# Patient Record
Sex: Female | Born: 1975 | Race: White | Hispanic: No | Marital: Married | Smoking: Never smoker
Health system: Southern US, Community
[De-identification: ages and names within clinical notes are randomized; demographics above are authoritative.]

## PROBLEM LIST (undated history)

## (undated) DIAGNOSIS — N83209 Unspecified ovarian cyst, unspecified side: Secondary | ICD-10-CM

## (undated) DIAGNOSIS — O139 Gestational [pregnancy-induced] hypertension without significant proteinuria, unspecified trimester: Secondary | ICD-10-CM

## (undated) DIAGNOSIS — G35 Multiple sclerosis: Secondary | ICD-10-CM

## (undated) HISTORY — DX: Multiple sclerosis: G35

## (undated) HISTORY — PX: WISDOM TOOTH EXTRACTION: SHX21

## (undated) HISTORY — DX: Gestational (pregnancy-induced) hypertension without significant proteinuria, unspecified trimester: O13.9

## (undated) HISTORY — PX: EYE SURGERY: SHX253

## (undated) HISTORY — DX: Unspecified ovarian cyst, unspecified side: N83.209

---

## 2006-12-14 ENCOUNTER — Encounter: Admission: RE | Admit: 2006-12-14 | Discharge: 2006-12-14 | Payer: Self-pay | Admitting: Internal Medicine

## 2006-12-17 ENCOUNTER — Encounter: Admission: RE | Admit: 2006-12-17 | Discharge: 2006-12-17 | Payer: Self-pay | Admitting: Internal Medicine

## 2010-01-06 ENCOUNTER — Inpatient Hospital Stay (HOSPITAL_COMMUNITY): Admission: AD | Admit: 2010-01-06 | Discharge: 2010-01-06 | Payer: Self-pay | Admitting: Obstetrics and Gynecology

## 2010-01-09 ENCOUNTER — Inpatient Hospital Stay (HOSPITAL_COMMUNITY): Admission: AD | Admit: 2010-01-09 | Discharge: 2010-01-10 | Payer: Self-pay | Admitting: Obstetrics and Gynecology

## 2010-07-29 LAB — COMPREHENSIVE METABOLIC PANEL
ALT: 18 U/L (ref 0–35)
AST: 31 U/L (ref 0–37)
Albumin: 3.3 g/dL — ABNORMAL LOW (ref 3.5–5.2)
BUN: 11 mg/dL (ref 6–23)
CO2: 25 mEq/L (ref 19–32)
Calcium: 9.1 mg/dL (ref 8.4–10.5)
Calcium: 9.2 mg/dL (ref 8.4–10.5)
Chloride: 104 mEq/L (ref 96–112)
Chloride: 104 mEq/L (ref 96–112)
Creatinine, Ser: 0.69 mg/dL (ref 0.4–1.2)
Creatinine, Ser: 0.71 mg/dL (ref 0.4–1.2)
GFR calc Af Amer: >60 mL/min (ref >60)
GFR calc non Af Amer: >60 mL/min (ref >60)
Glucose, Bld: 86 mg/dL (ref 70–99)
Sodium: 136 mEq/L (ref 135–145)
Total Bilirubin: 0.3 mg/dL (ref 0.3–1.2)
Total Protein: 6.6 g/dL (ref 6.0–8.3)

## 2010-07-29 LAB — CBC
HCT: 33.7 % — ABNORMAL LOW (ref 36.0–46.0)
HCT: 37.7 % (ref 36.0–46.0)
Hemoglobin: 11.6 g/dL — ABNORMAL LOW (ref 12.0–15.0)
Hemoglobin: 12.9 g/dL (ref 12.0–15.0)
MCH: 31.3 pg (ref 26.0–34.0)
MCH: 31.5 pg (ref 26.0–34.0)
MCHC: 34.2 g/dL (ref 30.0–36.0)
MCHC: 34.3 g/dL (ref 30.0–36.0)
MCV: 91.5 fL (ref 78.0–100.0)
Platelets: 195 10*3/uL (ref 150–400)
RBC: 3.66 MIL/uL — ABNORMAL LOW (ref 3.87–5.11)
RBC: 4.1 MIL/uL (ref 3.87–5.11)
RBC: 4.31 MIL/uL (ref 3.87–5.11)
RDW: 14.2 % (ref 11.5–15.5)
WBC: 17.9 10*3/uL — ABNORMAL HIGH (ref 4.0–10.5)

## 2010-07-29 LAB — URINALYSIS, ROUTINE W REFLEX MICROSCOPIC
Bilirubin Urine: NEGATIVE
Ketones, ur: NEGATIVE mg/dL
Nitrite: NEGATIVE
Urobilinogen, UA: 0.2 mg/dL (ref 0.0–1.0)

## 2010-07-29 LAB — URIC ACID: Uric Acid, Serum: 6.9 mg/dL (ref 2.4–7.0)

## 2010-07-29 LAB — LACTATE DEHYDROGENASE: LDH: 146 U/L (ref 94–250)

## 2010-07-29 LAB — RPR: RPR Ser Ql: NONREACTIVE

## 2010-07-29 LAB — URINALYSIS, MICROSCOPIC ONLY
Glucose, UA: NEGATIVE mg/dL
Protein, ur: NEGATIVE mg/dL
Specific Gravity, Urine: 1.02 (ref 1.005–1.030)

## 2012-02-29 ENCOUNTER — Ambulatory Visit (INDEPENDENT_AMBULATORY_CARE_PROVIDER_SITE_OTHER): Payer: PRIVATE HEALTH INSURANCE | Admitting: Obstetrics and Gynecology

## 2012-02-29 DIAGNOSIS — Z331 Pregnant state, incidental: Secondary | ICD-10-CM

## 2012-02-29 DIAGNOSIS — Z3201 Encounter for pregnancy test, result positive: Secondary | ICD-10-CM

## 2012-02-29 LAB — POCT URINALYSIS DIPSTICK
Glucose, UA: NEGATIVE
Leukocytes, UA: NEGATIVE
Nitrite, UA: NEGATIVE
Urobilinogen, UA: NEGATIVE

## 2012-03-01 LAB — PRENATAL PANEL VII
Antibody Screen: NEGATIVE
Basophils Absolute: 0.1 10*3/uL (ref 0.0–0.1)
Eosinophils Absolute: 0.1 10*3/uL (ref 0.0–0.7)
Eosinophils Relative: 1 % (ref 0–5)
Hepatitis B Surface Ag: NEGATIVE
Lymphocytes Relative: 25 % (ref 12–46)
MCH: 30 pg (ref 26.0–34.0)
MCV: 87.9 fL (ref 78.0–100.0)
Neutrophils Relative %: 69 % (ref 43–77)
Platelets: 334 10*3/uL (ref 150–400)
RBC: 4.23 MIL/uL (ref 3.87–5.11)
RDW: 13.6 % (ref 11.5–15.5)
WBC: 10.8 10*3/uL — ABNORMAL HIGH (ref 4.0–10.5)

## 2012-03-03 ENCOUNTER — Encounter: Payer: Self-pay | Admitting: Obstetrics and Gynecology

## 2012-03-03 DIAGNOSIS — Z22330 Carrier of Group B streptococcus: Secondary | ICD-10-CM | POA: Insufficient documentation

## 2012-03-03 LAB — CULTURE, OB URINE: Colony Count: 2000

## 2012-03-16 ENCOUNTER — Telehealth: Payer: Self-pay | Admitting: Obstetrics and Gynecology

## 2012-03-16 NOTE — Telephone Encounter (Signed)
TC from pt at 7wks, reporting 2 episodes brownish spotting, once yesterday and once today, no BRB, denies pain or cramping. Enc pt to increase PO fluids. And rest as needed, call if BRB or pain.

## 2012-03-27 ENCOUNTER — Ambulatory Visit (INDEPENDENT_AMBULATORY_CARE_PROVIDER_SITE_OTHER): Payer: PRIVATE HEALTH INSURANCE | Admitting: Obstetrics and Gynecology

## 2012-03-27 ENCOUNTER — Encounter: Payer: Self-pay | Admitting: Obstetrics and Gynecology

## 2012-03-27 VITALS — BP 110/70 | Ht 61.0 in | Wt 124.0 lb

## 2012-03-27 DIAGNOSIS — Z1379 Encounter for other screening for genetic and chromosomal anomalies: Secondary | ICD-10-CM

## 2012-03-27 DIAGNOSIS — Z23 Encounter for immunization: Secondary | ICD-10-CM

## 2012-03-27 DIAGNOSIS — N939 Abnormal uterine and vaginal bleeding, unspecified: Secondary | ICD-10-CM

## 2012-03-27 DIAGNOSIS — N898 Other specified noninflammatory disorders of vagina: Secondary | ICD-10-CM

## 2012-03-27 DIAGNOSIS — Z331 Pregnant state, incidental: Secondary | ICD-10-CM

## 2012-03-27 DIAGNOSIS — Z124 Encounter for screening for malignant neoplasm of cervix: Secondary | ICD-10-CM

## 2012-03-27 LAB — POCT WET PREP (WET MOUNT)
Bacteria Wet Prep HPF POC: NEGATIVE
Clue Cells Wet Prep Whiff POC: NEGATIVE
WBC, Wet Prep HPF POC: NEGATIVE
pH: 5

## 2012-03-27 NOTE — Progress Notes (Signed)
Subjective:    Bridget Edwards is being seen today for her first obstetrical visit at [redacted]w[redacted]d gestation by LMP.  Has had small amount brown d/c. Used waterbirth tub last delivery--plans again.  Was admitted at 8 cm, and delivered quickly.  Plans Harmony testing. Some nausea, but no vomiting.  Her obstetrical history is significant for: Patient Active Problem List  Diagnosis  . GBS bacteruria  . Vaginal bleeding 1st trimester  More brown d/c, less consistent with bleeding.  Relationship with FOB:  Married, Bridget Edwards.  Feeding plan:   Breast--currently still breastfeeding 36 yo 1-2x/day  Pregnancy history fully reviewed.  The following portions of the patient's history were reviewed and updated as appropriate: allergies, current medications, past family history, past medical history, past social history, past surgical history and problem list.  Review of Systems Pertinent ROS is described in HPI   Objective:   BP 110/70  Ht 5\' 1"  (1.549 m)  Wt 124 lb (56.246 kg)  BMI 23.43 kg/m2  LMP 01/23/2012 Wt Readings from Last 1 Encounters:  03/27/12 124 lb (56.246 kg)   BMI: Body mass index is 23.43 kg/(m^2).  General: alert, cooperative and no distress HEENT: grossly normal  Thyroid: normal  Respiratory: clear to auscultation bilaterally Cardiovascular: regular rate and rhythm,  Breasts:  No dominant masses, nipples erect Gastrointestinal: soft, non-tender; no masses,  no organomegaly Extremities: extremities normal, no pain or edema Vaginal Bleeding: None  EXTERNAL GENITALIA: normal appearing vulva with no masses, tenderness or lesions VAGINA: no abnormal discharge or lesions CERVIX: no lesions or cervical motion tenderness; cervix closed, long, firm UTERUS: gravid and consistent with 9-10 weeks ADNEXA: no masses palpable and nontender OB EXAM PELVIMETRY: appears adequate  FHR:  160  bpm  Assessment:    Pregnancy at 9 1/7 weeks AMA Previous waterbirth--plans again GBS  bacteruria at NOB interview  Plan:     Prenatal panel reviewed and discussed with the patient:  yes Pap smear collected:  yes GC/Chlamydia collected:  yes Wet prep:  Negative Discussion of Genetic testing options: Desires Harmony--done today Prenatal vitamins recommended Problem list reviewed and updated. Rv'd how and when to call for emergencies Rv'd practice routines Discussed nutrition and exercise  Reviewed plan for GBS Rx in labor  Plan of care: Next visit:  4 weeks for ROB Other anticipated f/u:    Tx for GBS in labor Does not need to attend WB class again this pregnancy.   Nigel Bridgeman, CNM, [place]

## 2012-03-27 NOTE — Progress Notes (Signed)
[redacted]w[redacted]d Pt c/o intermittent brown d'c after wiping  Pap due today  Flu shot given today w/o difficulty

## 2012-03-29 LAB — PAP IG, CT-NG, RFX HPV ASCU

## 2012-04-01 ENCOUNTER — Telehealth: Payer: Self-pay

## 2012-04-01 DIAGNOSIS — Z331 Pregnant state, incidental: Secondary | ICD-10-CM

## 2012-04-01 NOTE — Telephone Encounter (Signed)
LM for pt to c/b regarding Harmony.

## 2012-04-02 ENCOUNTER — Other Ambulatory Visit: Payer: PRIVATE HEALTH INSURANCE

## 2012-04-02 DIAGNOSIS — Z331 Pregnant state, incidental: Secondary | ICD-10-CM

## 2012-04-02 NOTE — Telephone Encounter (Signed)
Spoke with pt informing her Bridget Edwards was drawn too early and needs to be redrawn. Also informed her she was not charged. Pt is extremely understanding and wants to come today. Pt scheduled for lab visit only at 3:30 pm. Pt agrees.

## 2012-04-05 LAB — HARMONY PRENATAL TEST: PDF Image: 0

## 2012-04-12 ENCOUNTER — Encounter: Payer: Self-pay | Admitting: Obstetrics and Gynecology

## 2012-04-12 DIAGNOSIS — O09529 Supervision of elderly multigravida, unspecified trimester: Secondary | ICD-10-CM | POA: Insufficient documentation

## 2012-04-12 LAB — HARMONY PRENATAL TEST
FETAL CFDNA PERCENTAGE: 8.6
PDF Image: 0

## 2012-04-15 ENCOUNTER — Telehealth: Payer: Self-pay | Admitting: Obstetrics and Gynecology

## 2012-04-15 NOTE — Telephone Encounter (Signed)
Message copied by Delon Sacramento on Mon Apr 15, 2012  9:57 AM ------      Message from: Cornelius Moras      Created: Fri Apr 12, 2012  6:18 PM      Regarding: Normal Harmony testing       Please notify patient of normal test results and of the plan for any abnormal results.      Normal:  Harmony      Abnormal:  None      Plan:   Keep scheduled appointment            Thanks!      VL

## 2012-04-15 NOTE — Telephone Encounter (Signed)
Tc to pt regarding msg per VL.  Lm on vm to call back.

## 2012-04-16 ENCOUNTER — Telehealth: Payer: Self-pay | Admitting: Obstetrics and Gynecology

## 2012-04-16 NOTE — Telephone Encounter (Signed)
Message copied by Delon Sacramento on Tue Apr 16, 2012 10:24 AM ------      Message from: Cornelius Moras      Created: Fri Apr 12, 2012  6:18 PM      Regarding: Normal Harmony testing       Please notify patient of normal test results and of the plan for any abnormal results.      Normal:  Harmony      Abnormal:  None      Plan:   Keep scheduled appointment            Thanks!      VL

## 2012-04-16 NOTE — Telephone Encounter (Signed)
Tc to pt regarding msg below.  Informed Harmony test results were normal, pt voices agreement.

## 2012-04-24 ENCOUNTER — Ambulatory Visit (INDEPENDENT_AMBULATORY_CARE_PROVIDER_SITE_OTHER): Payer: PRIVATE HEALTH INSURANCE

## 2012-04-24 VITALS — BP 120/72 | Wt 130.0 lb

## 2012-04-24 DIAGNOSIS — Z3490 Encounter for supervision of normal pregnancy, unspecified, unspecified trimester: Secondary | ICD-10-CM

## 2012-04-24 DIAGNOSIS — Z331 Pregnant state, incidental: Secondary | ICD-10-CM

## 2012-04-24 NOTE — Progress Notes (Signed)
[redacted]w[redacted]d Still some morning sickness but pos wt gain; Questions r/e Zicam use in pregnancy--no more than 11mg  of Zinc rec'd/day in pregnancy. Still BF'ng Bridget Edwards about 2x per day.  Anatomy visit after next.

## 2012-05-15 NOTE — L&D Delivery Note (Signed)
Delivery Note At 6:36 AM a viable female, "[name]", was delivered in waterbirth tub via Vaginal, Spontaneous Delivery (Presentation:ROA ;  ).  APGAR: 8, 9; weight .   Placenta status: Intact, Spontaneous.  Cord: 3 vessels with the following complications:None  Cord pH: NA  Mother received single dose of Ampicillin prior to delivery.  Anesthesia: None  Episiotomy: None Lacerations: 1st degree right periurethral--hemostatic, no repair required. Suture Repair: None Est. Blood Loss (mL): 250 cc  Mom to postpartum.  Baby to skin to skin. Family plans outpatient circumcision.Marland Kitchen  Nigel Bridgeman 10/19/2012, 7:32 AM

## 2012-05-27 ENCOUNTER — Encounter: Payer: Self-pay | Admitting: Obstetrics and Gynecology

## 2012-05-27 ENCOUNTER — Ambulatory Visit (INDEPENDENT_AMBULATORY_CARE_PROVIDER_SITE_OTHER): Payer: PRIVATE HEALTH INSURANCE | Admitting: Obstetrics and Gynecology

## 2012-05-27 VITALS — BP 100/58 | Wt 138.0 lb

## 2012-05-27 DIAGNOSIS — Z3689 Encounter for other specified antenatal screening: Secondary | ICD-10-CM

## 2012-05-27 NOTE — Progress Notes (Signed)
Pt still c/o of some N/V in the morning but tolerable. Discussed getting anatomy scan at [facility] and possibly AFP Pt mentioned recurrence of fungal type rash on her back. Previously rx Dorina Hoyer by PCP, ok for use in pregnancy. Rash appears darker pigmented diffused areas on back. No itching.

## 2012-05-27 NOTE — Progress Notes (Signed)
[redacted]w[redacted]d No concerns per pt

## 2012-05-28 ENCOUNTER — Encounter: Payer: PRIVATE HEALTH INSURANCE | Admitting: Obstetrics and Gynecology

## 2012-06-10 ENCOUNTER — Ambulatory Visit: Payer: PRIVATE HEALTH INSURANCE | Admitting: Obstetrics and Gynecology

## 2012-06-10 ENCOUNTER — Encounter: Payer: PRIVATE HEALTH INSURANCE | Admitting: Obstetrics and Gynecology

## 2012-06-10 ENCOUNTER — Encounter: Payer: Self-pay | Admitting: Obstetrics and Gynecology

## 2012-06-10 ENCOUNTER — Other Ambulatory Visit: Payer: PRIVATE HEALTH INSURANCE

## 2012-06-10 ENCOUNTER — Ambulatory Visit: Payer: PRIVATE HEALTH INSURANCE

## 2012-06-10 VITALS — BP 104/74 | Wt 137.0 lb

## 2012-06-10 DIAGNOSIS — Z3689 Encounter for other specified antenatal screening: Secondary | ICD-10-CM

## 2012-06-10 DIAGNOSIS — Z331 Pregnant state, incidental: Secondary | ICD-10-CM

## 2012-06-10 LAB — US OB DETAIL + 14 WK

## 2012-06-10 NOTE — Progress Notes (Signed)
[redacted]w[redacted]d

## 2012-06-10 NOTE — Progress Notes (Signed)
Pt stated no issues today.  Ultrasound shows:  SIUP  S=D     [place] EDD: 10/29/2012           EFW: 13 oz           Cervical length: 3.99 cm           Placenta localization: anterior           Fetal presentation: vertex                   Anatomy survey is normal           Gender : female Comments : Singleton pregnancy, placental edge is 6.8 cm from internal OS-normal, fluid is normal,vertical pocket =4.8 cm No abnormalies  Seen, profile ,palate,phltruim,nasal bone,open hands,5th digit,heel,feet seen. Normal ovaries ,no fluid in CDS, normal adenexas.

## 2012-06-10 NOTE — Progress Notes (Signed)
[redacted]w[redacted]d No complaints toady Anatomy US today:  SIUP; vtx; Anterior placenta; Fluid WNL; EFW 87.4%ile; Cx 3.99 cm; No anomalies seen Reviewed Harmony results, WNL

## 2012-07-11 ENCOUNTER — Ambulatory Visit: Payer: PRIVATE HEALTH INSURANCE | Admitting: Obstetrics and Gynecology

## 2012-07-11 ENCOUNTER — Encounter: Payer: Self-pay | Admitting: Obstetrics and Gynecology

## 2012-07-11 VITALS — BP 112/68 | Wt 145.0 lb

## 2012-07-11 DIAGNOSIS — Z3490 Encounter for supervision of normal pregnancy, unspecified, unspecified trimester: Secondary | ICD-10-CM

## 2012-07-11 NOTE — Progress Notes (Signed)
Doing well. Glucola, Hgb, RPR NV.

## 2012-07-11 NOTE — Progress Notes (Signed)
[redacted]w[redacted]d Pt states no c/o.

## 2012-08-07 ENCOUNTER — Other Ambulatory Visit: Payer: Self-pay | Admitting: Obstetrics and Gynecology

## 2012-08-07 LAB — CBC
MCV: 88.5 fL (ref 78.0–100.0)
Platelets: 228 10*3/uL (ref 150–400)
RBC: 4.01 MIL/uL (ref 3.87–5.11)
WBC: 13.4 10*3/uL — ABNORMAL HIGH (ref 4.0–10.5)

## 2012-09-02 ENCOUNTER — Encounter: Payer: Self-pay | Admitting: Obstetrics and Gynecology

## 2012-10-19 ENCOUNTER — Inpatient Hospital Stay (HOSPITAL_COMMUNITY)
Admission: AD | Admit: 2012-10-19 | Discharge: 2012-10-20 | DRG: 775 | Disposition: A | Discharge status: Home / Self Care | Payer: PRIVATE HEALTH INSURANCE | Source: Ambulatory Visit | Attending: Obstetrics and Gynecology | Admitting: Obstetrics and Gynecology

## 2012-10-19 ENCOUNTER — Encounter (HOSPITAL_COMMUNITY): Payer: Self-pay | Admitting: *Deleted

## 2012-10-19 DIAGNOSIS — O99892 Other specified diseases and conditions complicating childbirth: Secondary | ICD-10-CM | POA: Diagnosis present

## 2012-10-19 DIAGNOSIS — O09529 Supervision of elderly multigravida, unspecified trimester: Secondary | ICD-10-CM | POA: Diagnosis present

## 2012-10-19 DIAGNOSIS — O139 Gestational [pregnancy-induced] hypertension without significant proteinuria, unspecified trimester: Principal | ICD-10-CM | POA: Diagnosis present

## 2012-10-19 DIAGNOSIS — Z22330 Carrier of Group B streptococcus: Secondary | ICD-10-CM

## 2012-10-19 DIAGNOSIS — IMO0001 Reserved for inherently not codable concepts without codable children: Secondary | ICD-10-CM

## 2012-10-19 LAB — CBC
Hemoglobin: 12.7 g/dL (ref 12.0–15.0)
MCH: 30.5 pg (ref 26.0–34.0)
MCHC: 34.7 g/dL (ref 30.0–36.0)
MCV: 88 fL (ref 78.0–100.0)
RBC: 4.16 MIL/uL (ref 3.87–5.11)

## 2012-10-19 MED ORDER — WITCH HAZEL-GLYCERIN EX PADS: 1.0000 "application " | MEDICATED_PAD | CUTANEOUS | Status: DC | PRN

## 2012-10-19 MED ORDER — DIPHENHYDRAMINE HCL 25 MG PO CAPS: 25.0000 mg | ORAL_CAPSULE | Freq: Four times a day (QID) | ORAL | Status: DC | PRN

## 2012-10-19 MED ORDER — LANOLIN HYDROUS EX OINT: TOPICAL_OINTMENT | CUTANEOUS | Status: DC | PRN

## 2012-10-19 MED ORDER — LACTATED RINGERS IV SOLN
INTRAVENOUS | Status: DC
  Administered 2012-10-19: 06:00:00 via INTRAVENOUS

## 2012-10-19 MED ORDER — CITRIC ACID-SODIUM CITRATE 334-500 MG/5ML PO SOLN: 30.0000 mL | ORAL | Status: DC | PRN

## 2012-10-19 MED ORDER — ONDANSETRON HCL 4 MG/2ML IJ SOLN: 4.0000 mg | Freq: Four times a day (QID) | INTRAMUSCULAR | Status: DC | PRN

## 2012-10-19 MED ORDER — SENNOSIDES-DOCUSATE SODIUM 8.6-50 MG PO TABS
2.0000 | ORAL_TABLET | Freq: Every day | ORAL | Status: DC
  Administered 2012-10-19: 2 via ORAL

## 2012-10-19 MED ORDER — TETANUS-DIPHTH-ACELL PERTUSSIS 5-2.5-18.5 LF-MCG/0.5 IM SUSP: 0.5000 mL | Freq: Once | INTRAMUSCULAR | Status: DC

## 2012-10-19 MED ORDER — DIBUCAINE 1 % RE OINT: 1.0000 "application " | TOPICAL_OINTMENT | RECTAL | Status: DC | PRN

## 2012-10-19 MED ORDER — OXYCODONE-ACETAMINOPHEN 5-325 MG PO TABS: 1.0000 | ORAL_TABLET | ORAL | Status: DC | PRN

## 2012-10-19 MED ORDER — OXYTOCIN BOLUS FROM INFUSION: 500.0000 mL | INTRAVENOUS | Status: DC

## 2012-10-19 MED ORDER — LIDOCAINE HCL (PF) 1 % IJ SOLN
30.0000 mL | INTRAMUSCULAR | Status: DC | PRN
Start: 2012-10-19 — End: 2012-10-19
  Filled 2012-10-19: qty 30

## 2012-10-19 MED ORDER — IBUPROFEN 600 MG PO TABS
600.0000 mg | ORAL_TABLET | Freq: Four times a day (QID) | ORAL | Status: DC
  Administered 2012-10-19 – 2012-10-20 (×4): 600 mg via ORAL
  Filled 2012-10-19 (×4): qty 1

## 2012-10-19 MED ORDER — PRENATAL MULTIVITAMIN CH
1.0000 | ORAL_TABLET | Freq: Every day | ORAL | Status: DC
  Administered 2012-10-19: 1 via ORAL
  Filled 2012-10-19: qty 1

## 2012-10-19 MED ORDER — ACETAMINOPHEN 325 MG PO TABS: 650.0000 mg | ORAL_TABLET | ORAL | Status: DC | PRN

## 2012-10-19 MED ORDER — IBUPROFEN 600 MG PO TABS
600.0000 mg | ORAL_TABLET | Freq: Four times a day (QID) | ORAL | Status: DC | PRN
  Administered 2012-10-19: 600 mg via ORAL
  Filled 2012-10-19: qty 1

## 2012-10-19 MED ORDER — SIMETHICONE 80 MG PO CHEW: 80.0000 mg | CHEWABLE_TABLET | ORAL | Status: DC | PRN

## 2012-10-19 MED ORDER — OXYTOCIN 40 UNITS IN LACTATED RINGERS INFUSION - SIMPLE MED: 62.5000 mL/h | INTRAVENOUS | Status: DC

## 2012-10-19 MED ORDER — LACTATED RINGERS IV SOLN: 500.0000 mL | INTRAVENOUS | Status: DC | PRN

## 2012-10-19 MED ORDER — ONDANSETRON HCL 4 MG/2ML IJ SOLN: 4.0000 mg | INTRAMUSCULAR | Status: DC | PRN

## 2012-10-19 MED ORDER — FLEET ENEMA 7-19 GM/118ML RE ENEM: 1.0000 | ENEMA | Freq: Once | RECTAL | Status: DC

## 2012-10-19 MED ORDER — BENZOCAINE-MENTHOL 20-0.5 % EX AERO: 1.0000 "application " | INHALATION_SPRAY | CUTANEOUS | Status: DC | PRN

## 2012-10-19 MED ORDER — ONDANSETRON HCL 4 MG PO TABS: 4.0000 mg | ORAL_TABLET | ORAL | Status: DC | PRN

## 2012-10-19 MED ORDER — SODIUM CHLORIDE 0.9 % IV SOLN
2.0000 g | Freq: Once | INTRAVENOUS | Status: AC
  Administered 2012-10-19: 2 g via INTRAVENOUS
  Filled 2012-10-19: qty 2000

## 2012-10-19 MED ORDER — ZOLPIDEM TARTRATE 5 MG PO TABS: 5.0000 mg | ORAL_TABLET | Freq: Every evening | ORAL | Status: DC | PRN

## 2012-10-19 NOTE — H&P (Signed)
Bridget Edwards is a 37 y.o. female, G2P1001 at 51 4/7 weeks, presenting for active labor, with SROM at 2:30am, clear fluid, + FM, and UCs q 4 min.  No recent cervical exam.  Plans waterbirth--1st delivery was waterbirth, with presentation to hospital 8 cm and subsequently rapid progression to delivery.  Viviana Simpler, doula, is accompanying patient and her husband on admission.  Patient Active Problem List   Diagnosis Date Noted  . PIH (pregnancy induced hypertension), previous postpartum condition 10/19/2012  . AMA (advanced maternal age) multigravida 35+ 04/12/2012  . Vaginal bleeding 1st trimester 03/27/2012  . GBS bacteruria 03/03/2012  Hx rapid labor  History of present pregnancy: Patient entered care at 9 1/7 weeks.   EDC of 10/29/12 was established by LMP and [place] at 19 6/7 weeks.   Anatomy scan:  19 6/7 weeks, with normal findings and an anterior placenta, EFW 87.4%ile.  Cervix WNL.  Additional [place] evaluations:  None Significant prenatal events:  Some low back pain--comfort measures reviewed.  Received Tdap in office.  Planned waterbirth--hx previous waterbirth, did not need repeat WB class.  Harmony testing done--WNL. Last evaluation:  10/17/12 in office, no VE done.  OB History   Grav Para Term Preterm Abortions TAB SAB Ect Mult Living   2 1 1       1     2011--SVB, 38 4/7 weeks, 5+15, female, waterbirth, delivered by [employer].  Had pp hypertension, took meds through 7 weeks  Past Medical History  Diagnosis Date  . Pregnancy induced hypertension     was put on BP meds PP;took meds past 6 wks PP visit for about 1 month  . Ovarian cyst     in the past   Past Surgical History  Procedure Laterality Date  . Wisdom tooth extraction      All 4 removed   Family History: family history includes Cancer in her paternal grandfather; Diabetes in her paternal grandfather; Heart disease in her paternal grandfather; Hypertension in her maternal grandfather, maternal grandmother, paternal  grandfather, and paternal grandmother; and Pulmonary embolism in her maternal grandfather.  Social History:  reports that she has never smoked. She has never used smokeless tobacco. She reports that she does not drink alcohol or use illicit drugs.  Husband, Greig Castilla, is present, involved, and supportive.  Patient is employed.   Prenatal Transfer Tool  Maternal Diabetes: No Genetic Screening: Normal Harmony Maternal Ultrasounds/Referrals: Normal Fetal Ultrasounds or other Referrals:  None Maternal Substance Abuse:  No Significant Maternal Medications:  None Significant Maternal Lab Results: Lab values include: Group B Strep positive    ROS: Contractions, leaking clear fluid, +FM.  No Known Allergies   Temperature 97.6 F (36.4 C), temperature source Oral, resp. rate 18, height 5\' 5"  (1.651 m), weight 156 lb 8 oz (70.988 kg), last menstrual period 01/23/2012.  Chest clear Heart RRR without murmur Abd gravid, NT, FH 37 cm Pelvic: cx 5-6, 90%, vtx, -1, leaking clear fluid Ext: WNL  FHR: Reactive, no decels UCs:  q 4 min, moderate  Prenatal labs: ABO, Rh: A/POS/-- (10/17 1506) Antibody: NEG (10/17 1506) Rubella:   Immune RPR: NON REAC (03/26 0940)  HBsAg: NEGATIVE (10/17 1506)  HIV: NON REACTIVE (10/17 1506)  GBS: Positive (10/17 0000)--+ GBS urine culture at NOB interview Pap:  WNL 03/2012 GC/chlamydia:  Neg 03/2012 Genetic screenings:  Normal Harmony Glucola:  WNL Hgb 12.7 at NOB, 11.9 at 28 weeks,  Assessment/Plan: IUP at 38 4/7 weeks Active labor SROM, clear fluid  GBS + Hx rapid labor Plans waterbirth  Plan: Admit to [facility] per consult with Dr. Estanislado Pandy Routine [facility] orders Plan Ampicillin for GBS prophylaxis--if not delivered in 4 hours, will start PCN G protocol OK for waterbirth tub use   Rechel Delosreyes, VICKICNM, [place] 10/19/2012, 6:02 AM

## 2012-10-20 LAB — CBC
HCT: 32.8 % — ABNORMAL LOW (ref 36.0–46.0)
MCHC: 33.2 g/dL (ref 30.0–36.0)
Platelets: 214 10*3/uL (ref 150–400)
RDW: 14 % (ref 11.5–15.5)
WBC: 16.6 10*3/uL — ABNORMAL HIGH (ref 4.0–10.5)

## 2012-10-20 MED ORDER — IBUPROFEN 600 MG PO TABS: 600.0000 mg | ORAL_TABLET | Freq: Four times a day (QID) | ORAL | Status: DC

## 2012-10-20 NOTE — Discharge Summary (Signed)
  Vaginal Delivery Discharge Summary  Bridget Edwards  DOB:    1975/09/16 MRN:    [id number] CSN:    [id number]  Date of admission:                  10/19/12  Date of discharge:                   10/20/12  Procedures this admission: Waterbirth with a periurethral lac no repair needed  Date of Delivery: 10/19/12 by Nigel Bridgeman  Newborn Data:  Live born female  Birth Weight: 7 lb 1.6 oz (3221 g) APGAR: 8, 9  Home with mother. Name: "Bridget Edwards" Circumcision Plan: Outpatient  History of Present Illness:  Bridget Edwards is a 37 y.o. female, G2P2002, who presents at [redacted]w[redacted]d weeks gestation. The patient has been followed at the [facility] and Gynecology division of [facility]. She was admitted onset of labor. Her pregnancy has been complicated by:  Patient Active Problem List   Diagnosis Date Noted  . PIH (pregnancy induced hypertension), previous postpartum condition 10/19/2012  . Vaginal delivery 10/19/2012  . Laceration of periurethral tissue with delivery--right 10/19/2012  . AMA (advanced maternal age) multigravida 35+ 04/12/2012  . Vaginal bleeding 1st trimester 03/27/2012  . GBS bacteruria 03/03/2012    Hospital course:  The patient was admitted for labor.   Her labor was not complicated. She proceeded to have a vaginal delivery of a healthy infant. Her delivery was not complicated. Her postpartum course was not complicated.  She was discharged to home on postpartum day 1 doing well.  Feeding:  breast  Contraception:  condoms  Discharge hemoglobin:  Hemoglobin  Date Value Range Status  10/20/2012 10.9* 12.0 - 15.0 g/dL Final     HCT  Date Value Range Status  10/20/2012 32.8* 36.0 - 46.0 % Final    Discharge Physical Exam:   General: alert, cooperative and no distress Lochia: appropriate Uterine Fundus: firm Incision: healing well DVT Evaluation: No evidence of DVT seen on physical exam. Negative Homan's sign.  Intrapartum  Procedures: spontaneous vaginal delivery and GBS prophylaxis Postpartum Procedures: none Complications-Operative and Postpartum: periurethral laceration  Discharge Diagnoses: Term Pregnancy-delivered  Discharge Information:  Activity:           pelvic rest Diet:                routine Medications: PNV and Ibuprofen Condition:      stable Instructions:  refer to practice specific booklet Discharge to: home     Haroldine Laws 10/20/2012

## 2012-10-29 ENCOUNTER — Inpatient Hospital Stay (HOSPITAL_COMMUNITY): Admission: AD | Admit: 2012-10-29 | Payer: Self-pay | Source: Ambulatory Visit | Admitting: Obstetrics and Gynecology

## 2012-10-31 NOTE — Progress Notes (Signed)
Ur chart review completed.  

## 2012-11-06 ENCOUNTER — Encounter: Payer: Self-pay | Admitting: Obstetrics and Gynecology

## 2013-03-20 ENCOUNTER — Other Ambulatory Visit: Payer: Self-pay

## 2014-03-16 ENCOUNTER — Encounter (HOSPITAL_COMMUNITY): Payer: Self-pay | Admitting: *Deleted

## 2015-06-28 ENCOUNTER — Encounter: Payer: Self-pay | Admitting: Neurology

## 2015-06-28 ENCOUNTER — Ambulatory Visit (INDEPENDENT_AMBULATORY_CARE_PROVIDER_SITE_OTHER): Payer: No Typology Code available for payment source | Admitting: Neurology

## 2015-06-28 VITALS — BP 130/80 | HR 83 | Ht 65.0 in | Wt 137.0 lb

## 2015-06-28 DIAGNOSIS — R202 Paresthesia of skin: Secondary | ICD-10-CM

## 2015-06-28 NOTE — Progress Notes (Signed)
[facility] Neurology Division Clinic Note - Initial Visit   Date: 06/28/2015  Bridget Edwards MRN: [id number] DOB: November 14, 1975   Dear Dr. Nehemiah Settle:  Thank you for your kind referral of Bridget Edwards for consultation of numbness of the feet. Although her history is well known to you, please allow [place] to reiterate it for the purpose of our medical record. The patient was accompanied to the clinic by self.   History of Present Illness: Bridget Edwards is a 40 y.o. right-handed Caucasian female with no prior medical problems presenting for evaluation of bilateral leg numbness.   Starting around 2015, she began having constant numbness over the tips of the toes and since September 2016 but this slowly improved until two weeks ago.  In late January, she began having the same sensation travelling up the legs to the level of the waist and worse on the right.  She has occasional sharp pain over the side of the feet, but this is very brief and seldom.  She has noticed that wearing shoes sometimes helps.  She has not noticed anything that alleviates her symptoms.  She notices the numbness more when she walks, but rest does not help.   She denies imbalance, leg weakness, back pain, or bowel/bladder incontinence.    No history of alcohol abuse or diabetes.  She saw her PCP for these symptoms who checked TSH, vitamin B12, folate, and electrolytes which returned normal.   Out-side paper records, electronic medical record, and images have been reviewed where available and summarized as:  Labs 09/15/2014:  TSH 2.90, CBC and CMP is normal Labs 01/15/2015:  Vitamin B12 961, folate > 20   Past Medical History  Diagnosis Date  . Pregnancy induced hypertension     was put on BP meds PP;took meds past 6 wks PP visit for about 1 month  . Ovarian cyst     in the past    Past Surgical History  Procedure Laterality Date  . Wisdom tooth extraction      All 4 removed     Medications:  Outpatient  Encounter Prescriptions as of 06/28/2015  Medication Sig  . ibuprofen (ADVIL,MOTRIN) 600 MG tablet Take 1 tablet (600 mg total) by mouth every 6 (six) hours.  Marland Kitchen PRENATAL VITAMINS PO Take 1 tablet by mouth daily.   No facility-administered encounter medications on file as of 06/28/2015.     Allergies: No Known Allergies  Family History: Family History  Problem Relation Age of Onset  . Heart disease Paternal Grandfather     deceased  . Diabetes Paternal Grandfather   . Hypertension Maternal Grandmother   . Hypertension Maternal Grandfather   . Hypertension Paternal Grandmother   . Hypertension Paternal Grandfather   . Pulmonary embolism Maternal Grandfather     DVT  . Cancer Paternal Grandfather     ? type  . Healthy Mother   . Healthy Father     Social History: Social History  Substance Use Topics  . Smoking status: Never Smoker   . Smokeless tobacco: Never Used  . Alcohol Use: 0.0 oz/week    0 Standard drinks or equivalent per week     Comment: Occasionally   Social History   Social History Narrative   Lives with husband and 2 children in a 2 story home.     Works full time.     Education: Masters in [employer]    Review of Systems:  CONSTITUTIONAL: No fevers, chills, night sweats, or weight  loss.   EYES: No visual changes or eye pain ENT: No hearing changes.  No history of nose bleeds.   RESPIRATORY: No cough, wheezing and shortness of breath.   CARDIOVASCULAR: Negative for chest pain, and palpitations.   GI: Negative for abdominal discomfort, blood in stools or black stools.  No recent change in bowel habits.   GU:  No history of incontinence.   MUSCLOSKELETAL: No history of joint pain or swelling.  No myalgias.   SKIN: Negative for lesions, rash, and itching.   HEMATOLOGY/ONCOLOGY: Negative for prolonged bleeding, bruising easily, and swollen nodes.  No history of cancer.   ENDOCRINE: Negative for cold or heat intolerance, polydipsia or goiter.   PSYCH:  No  depression or anxiety symptoms.   NEURO: As Above.   Vital Signs:  BP 130/80 mmHg  Pulse 83  Ht 5\' 5"  (1.651 m)  Wt 137 lb (62.143 kg)  BMI 22.80 kg/m2  SpO2 99% Pain Scale: 0 on a scale of 0-10   General Medical Exam:   General:  Well appearing, comfortable.   Eyes/ENT: see cranial nerve examination.   Neck: No masses appreciated.  Full range of motion without tenderness.  No carotid bruits. Respiratory:  Clear to auscultation, good air entry bilaterally.   Cardiac:  Regular rate and rhythm, no murmur.   Extremities:  No deformities, edema, or skin discoloration.  Skin:  No rashes or lesions.  Neurological Exam: MENTAL STATUS including orientation to time, place, person, recent and remote memory, attention span and concentration, language, and fund of knowledge is normal.  Speech is not dysarthric.  CRANIAL NERVES: II:  No visual field defects.  Unremarkable fundi.   III-IV-VI: Pupils equal round and reactive to light.  Normal conjugate, extra-ocular eye movements in all directions of gaze.  No nystagmus.  No ptosis.   V:  Normal facial sensation.     VII:  Normal facial symmetry and movements.  No pathologic facial reflexes.  VIII:  Normal hearing and vestibular function.   IX-X:  Normal palatal movement.   XI:  Normal shoulder shrug and head rotation.   XII:  Normal tongue strength and range of motion, no deviation or fasciculation.  MOTOR:  No atrophy, fasciculations or abnormal movements.  No pronator drift.  Tone is normal.    Right Upper Extremity:    Left Upper Extremity:    Deltoid  5/5   Deltoid  5/5   Biceps  5/5   Biceps  5/5   Triceps  5/5   Triceps  5/5   Wrist extensors  5/5   Wrist extensors  5/5   Wrist flexors  5/5   Wrist flexors  5/5   Finger extensors  5/5   Finger extensors  5/5   Finger flexors  5/5   Finger flexors  5/5   Dorsal interossei  5/5   Dorsal interossei  5/5   Abductor pollicis  5/5   Abductor pollicis  5/5   Tone (Ashworth scale)  0   Tone (Ashworth scale)  0   Right Lower Extremity:    Left Lower Extremity:    Hip flexors  5/5   Hip flexors  5/5   Hip extensors  5/5   Hip extensors  5/5   Knee flexors  5/5   Knee flexors  5/5   Knee extensors  5/5   Knee extensors  5/5   Dorsiflexors  5/5   Dorsiflexors  5/5   Plantarflexors  5/5   Plantarflexors  5/5   Toe extensors  5/5   Toe extensors  5/5   Toe flexors  5/5   Toe flexors  5/5   Tone (Ashworth scale)  0  Tone (Ashworth scale)  0   MSRs:  Right                                                                 Left brachioradialis 2+  brachioradialis 2+  biceps 2+  biceps 2+  triceps 2+  triceps 2+  patellar 2+  patellar 2+  ankle jerk 2+  ankle jerk 2+  Hoffman no  Hoffman no  plantar response down  plantar response down   SENSORY:  Normal and symmetric perception of light touch, pinprick, vibration, and proprioception.  Romberg's sign absent.   COORDINATION/GAIT: Normal finger-to- nose-finger and heel-to-shin.  Intact rapid alternating movements bilaterally.  Able to rise from a chair without using arms.  Gait narrow based and stable. Tandem and stressed gait intact.    IMPRESSION: Mrs. Schmoker is a 40 year-old female referred for evaluation of bilateral lower extremity paresthesias.  Her exam is entirely normal and non-focal. It is unusual to have bilateral entire lower extremity numbness in the setting of a normal exam.  No signs of myelopathy or neuropathy on exam.  Her TSH, vitamin B12, folate, and electrolytes has been normal.  I will order NCS/EMG of the legs and decide if additional labs are indicated based on the results.  If her symptoms persist and electrodiagnostic testing is normal, imaging of the lumbar spine will be the next step.   The duration of this appointment visit was 35 minutes of face-to-face time with the patient.  Greater than 50% of this time was spent in counseling, explanation of diagnosis, planning of further management, and  coordination of care.   Thank you for allowing me to participate in patient's care.  If I can answer any additional questions, I would be pleased to do so.    Sincerely,    Keval Nam K. Allena Katz, DO

## 2015-06-28 NOTE — Progress Notes (Signed)
Note routed

## 2015-06-28 NOTE — Patient Instructions (Signed)
NCS/EMG of the legs We will call you with the results of the testing and determine the next step based on the results

## 2015-06-29 ENCOUNTER — Other Ambulatory Visit (INDEPENDENT_AMBULATORY_CARE_PROVIDER_SITE_OTHER): Payer: No Typology Code available for payment source

## 2015-06-29 ENCOUNTER — Other Ambulatory Visit: Payer: Self-pay | Admitting: *Deleted

## 2015-06-29 ENCOUNTER — Telehealth: Payer: Self-pay | Admitting: Neurology

## 2015-06-29 ENCOUNTER — Ambulatory Visit (INDEPENDENT_AMBULATORY_CARE_PROVIDER_SITE_OTHER): Payer: No Typology Code available for payment source | Admitting: Neurology

## 2015-06-29 DIAGNOSIS — R202 Paresthesia of skin: Secondary | ICD-10-CM

## 2015-06-29 LAB — SEDIMENTATION RATE: SED RATE: 16 mm/h (ref 0–22)

## 2015-06-29 LAB — TSH: TSH: 2.23 u[IU]/mL (ref 0.35–4.50)

## 2015-06-29 LAB — VITAMIN D 25 HYDROXY (VIT D DEFICIENCY, FRACTURES): VITD: 19.63 ng/mL — ABNORMAL LOW (ref 30.00–100.00)

## 2015-06-29 NOTE — Procedures (Signed)
[facility]  [address] [place], [address]  [place], [place] [address] Tel: [phone] Fax:  [phone] Test Date:  06/29/2015  Patient: Bridget Edwards DOB: [birth year] Physician: Nita Sickle  Sex: Female Height: 5\' 5"  Ref Phys: Nita Sickle  ID#: [id number] Temp: 33.2C Technician: Judie Petit. Dean   Patient Complaints: This is a 40 year-old female referred for bilateral numbness in legs, worse on the right.  NCV & EMG Findings: Extensive electrodiagnostic testing of the right lower extremity and additional studies of the left shows: 1. Bilateral sural and superficial peroneal sensory responses are within normal limits. 2. Bilateral peroneal and tibial motor responses are within normal limits. 3. Bilateral tibial H reflex studies are within normal limits. 4. There is no evidence of active or chronic motor axon loss changes affecting any of the tested muscles. Motor unit configuration and recruitment pattern is within normal limits.  Impression: This is a normal study of the lower extremities. In particular, there is no evidence of a generalized sensorimotor polyneuropathy or lumbosacral radiculopathy.   _____________________________ Nita Sickle, D.O.    Nerve Conduction Studies Anti Sensory Summary Table   Stim Site NR Peak (ms) Norm Peak (ms) P-T Amp (V) Norm P-T Amp  Left Sup Peroneal Anti Sensory (Ant Lat Mall)  33.2C  12 cm    3.0 <4.5 14.6 >5  Right Sup Peroneal Anti Sensory (Ant Lat Mall)  33.2C  12 cm    2.8 <4.5 13.6 >5  Left Sural Anti Sensory (Lat Mall)  33.2C  Calf    3.9 <4.5 26.1 >5  Right Sural Anti Sensory (Lat Mall)  33.2C  Calf    3.6 <4.5 22.1 >5   Motor Summary Table   Stim Site NR Onset (ms) Norm Onset (ms) O-P Amp (mV) Norm O-P Amp Site1 Site2 Delta-0 (ms) Dist (cm) Vel (m/s) Norm Vel (m/s)  Left Peroneal Motor (Ext Dig Brev)  33.2C  Ankle    4.2 <5.5 6.0 >3 B Fib Ankle 6.7 33.0 49 >40  B Fib    10.9  5.8  Poplt B Fib 1.9 10.0 53 >40    Poplt    12.8  5.8         Right Peroneal Motor (Ext Dig Brev)  33.2C  Ankle    3.1 <5.5 6.6 >3 B Fib Ankle 6.2 32.0 52 >40  B Fib    9.3  5.9  Poplt B Fib 1.6 10.0 63 >40  Poplt    10.9  5.9         Left Tibial Motor (Abd Hall Brev)  33.2C  Ankle    3.7 <6.0 14.4 >8 Knee Ankle 8.5 38.0 45 >40  Knee    12.2  7.9         Right Tibial Motor (Abd Hall Brev)  33.2C  Ankle    3.8 <6.0 16.6 >8 Knee Ankle 7.6 38.0 50 >40  Knee    11.4  10.7          H Reflex Studies   NR H-Lat (ms) Lat Norm (ms) L-R H-Lat (ms) M-Lat (ms) HLat-MLat (ms)  Left Tibial (Gastroc)  33.2C     31.56 <35 0.68 3.95 27.61  Right Tibial (Gastroc)  33.2C     30.88 <35 0.68 3.95 26.93   EMG   Side Muscle Ins Act Fibs Psw Fasc Number Recrt Dur Dur. Amp Amp. Poly Poly. Comment  Right RectFemoris Nml Nml Nml Nml Nml Nml Nml Nml Nml Nml Nml Nml  N/A  Right AntTibialis Nml Nml Nml Nml Nml Nml Nml Nml Nml Nml Nml Nml N/A  Right Gastroc Nml Nml Nml Nml Nml Nml Nml Nml Nml Nml Nml Nml N/A  Right Flex Dig Long Nml Nml Nml Nml Nml Nml Nml Nml Nml Nml Nml Nml N/A  Right GluteusMed Nml Nml Nml Nml Nml Nml Nml Nml Nml Nml Nml Nml N/A  Right BicepsFemS Nml Nml Nml Nml Nml Nml Nml Nml Nml Nml Nml Nml N/A  Left AntTibialis Nml Nml Nml Nml Nml Nml Nml Nml Nml Nml Nml Nml N/A  Left Gastroc Nml Nml Nml Nml Nml Nml Nml Nml Nml Nml Nml Nml N/A  Left RectFemoris Nml Nml Nml Nml Nml Nml Nml Nml Nml Nml Nml Nml N/A      Waveforms:

## 2015-06-29 NOTE — Telephone Encounter (Signed)
Results of her EMG were discussed, which is normal. There is no evidence of nerve or muscle injury. I will check a few additional lab tests including ESR, TSH, copper, ANA and vitamin D.  Because her exam is otherwise normal, she will follow her symptoms and contact me in 2-4 weeks. If there is no improvement, proceed with MRI brain and/or lumbar spine, based on how her symptoms evolve.  Donika K. Patel, DO   

## 2015-06-30 LAB — ANA: ANA: NEGATIVE

## 2015-07-03 LAB — COPPER, SERUM: Copper: 117 ug/dL (ref 70–175)

## 2015-07-05 ENCOUNTER — Other Ambulatory Visit: Payer: Self-pay | Admitting: *Deleted

## 2015-07-05 DIAGNOSIS — R202 Paresthesia of skin: Secondary | ICD-10-CM

## 2015-07-05 DIAGNOSIS — E559 Vitamin D deficiency, unspecified: Secondary | ICD-10-CM

## 2016-11-22 ENCOUNTER — Telehealth: Payer: Self-pay | Admitting: Neurology

## 2016-11-22 ENCOUNTER — Other Ambulatory Visit: Payer: Self-pay | Admitting: *Deleted

## 2016-11-22 DIAGNOSIS — R202 Paresthesia of skin: Secondary | ICD-10-CM

## 2016-11-22 DIAGNOSIS — R292 Abnormal reflex: Secondary | ICD-10-CM

## 2016-11-22 DIAGNOSIS — H4600 Optic papillitis, unspecified eye: Secondary | ICD-10-CM

## 2016-11-22 NOTE — Telephone Encounter (Signed)
Left message informing patient that we received her report from eye doctor and I have placed the referrals for the MRIs.  [facility] will be calling her to set up these appointments.

## 2016-11-22 NOTE — Telephone Encounter (Signed)
Note from Dr. Santiago Bumpers at [facility] dated 11/21/2016 was received and shows right eye papillitis.  She was recommended to f/u with neurology for further evaluation.  Recommend MRI brain and cervical spine wwo contrast.  She will also likely need CSF testing going forward (CSF cell count and diff, protein, glucose, IgG index, oligoclonal bands), but will start with imaging.    Morrie Sheldon, please inform pt that we rec'd the report from her visit to the eye doctor and would like to proceed with additional testing as noted above to determine the cause of her eye pain and vision changes.  She will need to f/u with me after the testing.  Donika K. Allena Katz, DO

## 2016-12-11 ENCOUNTER — Telehealth: Payer: Self-pay | Admitting: Neurology

## 2016-12-11 ENCOUNTER — Ambulatory Visit
Admission: RE | Admit: 2016-12-11 | Discharge: 2016-12-11 | Disposition: A | Discharge status: Home / Self Care | Payer: No Typology Code available for payment source | Source: Ambulatory Visit | Attending: Neurology | Admitting: Neurology

## 2016-12-11 DIAGNOSIS — H4600 Optic papillitis, unspecified eye: Secondary | ICD-10-CM

## 2016-12-11 DIAGNOSIS — R292 Abnormal reflex: Secondary | ICD-10-CM

## 2016-12-11 DIAGNOSIS — R202 Paresthesia of skin: Secondary | ICD-10-CM

## 2016-12-11 MED ORDER — GADOBENATE DIMEGLUMINE 529 MG/ML IV SOLN
13.0000 mL | Freq: Once | INTRAVENOUS | Status: AC | PRN
  Administered 2016-12-11: 13 mL via INTRAVENOUS

## 2016-12-11 NOTE — Telephone Encounter (Signed)
Called and discussed results of MRI brain and cervical spine with patient which shows scatter white matter changes in the deep and subcortical region as well as multifocal areas of T2 hyperintensity in the cord with enhancement at T1-2.     The next step is LP for CSF analysis - check CSF cell count and diff, protein, glucose, IgG index, oligoclonal bands, flow cytology.   If CSF is consistent with demyelinating disease, proceed with IVMP 1g x 3 days.  Patient is aware of the plan.  Donika K. Allena Katz, DO

## 2016-12-12 ENCOUNTER — Other Ambulatory Visit: Payer: Self-pay | Admitting: *Deleted

## 2016-12-12 DIAGNOSIS — R202 Paresthesia of skin: Secondary | ICD-10-CM

## 2016-12-12 DIAGNOSIS — R9089 Other abnormal findings on diagnostic imaging of central nervous system: Secondary | ICD-10-CM

## 2016-12-12 NOTE — Telephone Encounter (Signed)
Orders placed and Roberta notified to call patient to schedule.

## 2016-12-21 ENCOUNTER — Ambulatory Visit
Admission: RE | Admit: 2016-12-21 | Discharge: 2016-12-21 | Disposition: A | Discharge status: Home / Self Care | Payer: No Typology Code available for payment source | Source: Ambulatory Visit | Attending: Neurology | Admitting: Neurology

## 2016-12-21 ENCOUNTER — Other Ambulatory Visit (HOSPITAL_COMMUNITY)
Admission: RE | Admit: 2016-12-21 | Discharge: 2016-12-21 | Disposition: A | Discharge status: Home / Self Care | Payer: No Typology Code available for payment source | Source: Ambulatory Visit | Attending: Neurology | Admitting: Neurology

## 2016-12-21 VITALS — BP 144/87 | HR 69

## 2016-12-21 DIAGNOSIS — R9089 Other abnormal findings on diagnostic imaging of central nervous system: Secondary | ICD-10-CM | POA: Insufficient documentation

## 2016-12-21 DIAGNOSIS — R202 Paresthesia of skin: Secondary | ICD-10-CM

## 2016-12-21 LAB — CSF CELL COUNT WITH DIFFERENTIAL
Basophils, %: 0 %
EOS CSF: 0 %
LYMPHS CSF: 95 % — AB (ref 40–80)
Monocyte/Macrophage: 5 % — ABNORMAL LOW (ref 15–45)
RBC Count, CSF: 1 cells/uL (ref 0–10)
SEGMENTED NEUTROPHILS-CSF: 0 % (ref 0–6)
WBC CSF: 8 {cells}/uL — AB (ref 0–5)

## 2016-12-21 LAB — PROTEIN, CSF: Total Protein, CSF: 37 mg/dL (ref 15–45)

## 2016-12-21 LAB — GLUCOSE, CSF: Glucose, CSF: 67 mg/dL (ref 43–76)

## 2016-12-21 NOTE — Progress Notes (Signed)
1SST tube and 1 lavender tube drawn from left AC to go with spinal fluid. Site is unremarkable and pt tolerated well . Discharge instructions explained to pt

## 2016-12-21 NOTE — Discharge Instructions (Signed)

## 2016-12-25 ENCOUNTER — Telehealth: Payer: Self-pay | Admitting: Neurology

## 2016-12-25 MED ORDER — BUTALBITAL-APAP-CAFFEINE 50-325-40 MG PO TABS: 1.0000 | ORAL_TABLET | Freq: Four times a day (QID) | ORAL | 0 refills | Status: DC | PRN

## 2016-12-25 NOTE — Telephone Encounter (Signed)
Will offer her a trial of fioricet for headaches, if no improvement, she may need a blood patch.  Please encourage her to continue to stay well-hydrated.  Cylie Dor K. Allena Katz, DO

## 2016-12-25 NOTE — Telephone Encounter (Signed)
Please advise 

## 2016-12-25 NOTE — Telephone Encounter (Signed)
Patient given instructions.  Will fax Rx to [facility]

## 2016-12-25 NOTE — Telephone Encounter (Signed)
Patient called to say that she had her Lumbar puncture last week and that she is still having headaches when she stands up for a while. Please call. Thanks

## 2016-12-26 LAB — LEUKEMIA/LYMPHOMA EVALUATION PANEL
NUMBER OF MARKERS: 22
Viability:: 34 %

## 2016-12-27 LAB — CNS IGG SYNTHESIS RATE, CSF+BLOOD
ALBUMIN, SERUM(NEPH): 4.7 g/dL (ref 3.5–5.2)
Albumin, CSF: 18 mg/dL (ref 8.0–42.0)
IGG INDEX, CSF: 0.7 — AB (ref <0.66)
IGG, SERUM: 1050 mg/dL (ref 694–1618)
IgG, CSF: 2.8 mg/dL (ref 0.8–7.7)
MS CNS IgG Synthesis Rate: 0.9 mg/24 h (ref <=3.3)

## 2016-12-28 NOTE — Progress Notes (Signed)
Follow-up Visit   Date: 12/29/16    Bridget Edwards MRN: [id number] DOB: December 15, 1975   Interim History: Bridget Edwards is a 41 y.o. right-handed Caucasian female returning to the clinic for follow-up of new diagnosis of multiple sclerosis.  The patient was accompanied to the clinic by self.  History of present illness: Starting around 2015, she began having constant numbness over the tips of the toes and since September 2016 but this slowly improved until early 2018.  In late January, she began having the same sensation travelling up the legs to the level of the waist and worse on the right.  She has occasional sharp pain over the side of the feet, but this is very brief and seldom.  She has noticed that wearing shoes sometimes helps.  She has not noticed anything that alleviates her symptoms.  She notices the numbness more when she walks, but rest does not help. She denies imbalance, leg weakness, back pain, or bowel/bladder incontinence.    No history of alcohol abuse or diabetes. She saw her PCP for these symptoms who checked TSH, vitamin B12, folate, and electrolytes which returned normal.  She underwent electrodiagnostic testing of the legs which returned normal.   UPDATE 12/29/2016:   She is here for follow-up.  In May, she started feeling as if there was "extra light" in her eye and later developed right eye pain with eye movement.  Note from Dr. Santiago Bumpers at [facility] dated 11/21/2016 was received and shows right eye papillitis.  She was recommended to f/u with neurology and underwent MRI brain, cervical spine, and CSF testing which was consistent with multiple sclerosis and discussed today.  She continues to have mild right eye pain, tingling of the legs, and occasionally, sharp pain in her back with neck flexion.  She has not noticed worsening symptoms in the heat.  She denies any weakness, but has rare spells as if her left knee can feel like it could buckle.   Medications:    Current Outpatient Prescriptions on File Prior to Visit  Medication Sig Dispense Refill  . butalbital-acetaminophen-caffeine (FIORICET, ESGIC) 50-325-40 MG tablet Take 1 tablet by mouth every 6 (six) hours as needed for headache. 12 tablet 0  . PRENATAL VITAMINS PO Take 1 tablet by mouth daily.     No current facility-administered medications on file prior to visit.     Allergies: No Known Allergies  Review of Systems:  CONSTITUTIONAL: No fevers, chills, night sweats, or weight loss.  EYES: No visual changes or eye pain ENT: No hearing changes.  No history of nose bleeds.   RESPIRATORY: No cough, wheezing and shortness of breath.   CARDIOVASCULAR: Negative for chest pain, and palpitations.   GI: Negative for abdominal discomfort, blood in stools or black stools.  No recent change in bowel habits.   GU:  No history of incontinence.   MUSCLOSKELETAL: No history of joint pain or swelling.  No myalgias.   SKIN: Negative for lesions, rash, and itching.   ENDOCRINE: Negative for cold or heat intolerance, polydipsia or goiter.   PSYCH:  No depression or anxiety symptoms.   NEURO: As Above.   Vital Signs:  BP 120/80   Pulse 72   Ht 5\' 5"  (1.651 m)   Wt 135 lb 4 oz (61.3 kg)   SpO2 98%   BMI 22.51 kg/m   Neurological Exam: MENTAL STATUS including orientation to time, place, person, recent and remote memory, attention span and concentration,  language, and fund of knowledge is normal.  Speech is not dysarthric.  CRANIAL NERVES: No visual field defects.  Pupils equal round and reactive to light.  Normal conjugate, extra-ocular eye movements in all directions of gaze.  No ptosis. Normal facial sensation.  Face is symmetric. Palate elevates symmetrically.  Tongue is midline.  MOTOR:  Motor strength is 5/5 in all extremities.  No atrophy, fasciculations or abnormal movements.  No pronator drift.  Tone is normal.    MSRs:  Reflexes are 2+/4 throughout.  Plantars are down  going.  SENSORY:  Intact to vibration and temperature.  COORDINATION/GAIT:  Normal finger-to- nose-finger and heel-to-shin.  Intact rapid alternating movements bilaterally.  Gait narrow based and stable.   Data: Labs 09/15/2014:  TSH 2.90, CBC and CMP is normal Labs 01/15/2015:  Vitamin B12 961, folate > 20  MRI cervical spine wwo contrast 12/11/2016:  Multifocal cord lesions highly likely to represent multiple sclerosis/ demyelinating disease. See above description.  MRI brain wwo contrast 12/11/2016:  Scattered small foci of T2 and FLAIR signal within the deep and subcortical hemispheric white matter, raising concern regarding the possibility of demyelinating disease/ multiple sclerosis. Old nonspecific gliotic white matter foci could have this appearance.  CSF 12/21/2016:  W8* (95% lymphocyte) R1  P37  G67, IgG index 0.7*, cytology increased mononuclear cells, flow cytometry no clonal B cells  IMPRESSION/PLAN: Bridget Edwards is a 41 year-old female returning for follow-up of newly diagnosed relapsing-remitting multiple sclerosis.  She has had a number of neurological symptoms in the past including paresthesias since 2015 and most recently papillitis. Her MRI brain and CSF testing was reviewed with patient which is consistent with multiple sclerosis. Imaging shows scattered cortical white matter changes and multifocal short segments of the cervical cord involved.  We discussed management of acute and chronic therapies.  For her acute exacerbation, start Solu-Medrol 1 g x 3 days.  Disease modifying therapies were also discussed and with her spinal cord involvement, I favor any Tefidera, Gilenya, or Tysabri. The side effects, risks and benefits of each of the medications were reviewed and literature was provided. She does not have any plans for future pregnancy. She was also recommended to start vitamin D 4000 units daily.  In the meantime, check baseline CBC and CMP.   I will see her back in 4 weeks to discuss  maintenance therapy.   The duration of this appointment visit was 40 minutes of face-to-face time with the patient.  Greater than 50% of this time was spent in counseling, explanation of diagnosis, planning of further management, and coordination of care.   Thank you for allowing me to participate in patient's care.  If I can answer any additional questions, I would be pleased to do so.    Sincerely,    Calistro Rauf K. Allena Katz, DO

## 2016-12-29 ENCOUNTER — Encounter: Payer: Self-pay | Admitting: Neurology

## 2016-12-29 ENCOUNTER — Ambulatory Visit (INDEPENDENT_AMBULATORY_CARE_PROVIDER_SITE_OTHER): Payer: No Typology Code available for payment source | Admitting: Neurology

## 2016-12-29 ENCOUNTER — Other Ambulatory Visit (INDEPENDENT_AMBULATORY_CARE_PROVIDER_SITE_OTHER): Payer: No Typology Code available for payment source

## 2016-12-29 VITALS — BP 120/80 | HR 72 | Ht 65.0 in | Wt 135.2 lb

## 2016-12-29 DIAGNOSIS — G35 Multiple sclerosis: Secondary | ICD-10-CM

## 2016-12-29 LAB — COMPREHENSIVE METABOLIC PANEL
ALBUMIN: 4.3 g/dL (ref 3.5–5.2)
ALT: 10 U/L (ref 0–35)
AST: 14 U/L (ref 0–37)
Alkaline Phosphatase: 46 U/L (ref 39–117)
BUN: 13 mg/dL (ref 6–23)
CHLORIDE: 104 meq/L (ref 96–112)
CO2: 25 mEq/L (ref 19–32)
CREATININE: 0.76 mg/dL (ref 0.40–1.20)
Calcium: 9.2 mg/dL (ref 8.4–10.5)
GFR: 89.2 mL/min (ref >60.00)
GLUCOSE: 91 mg/dL (ref 70–99)
Potassium: 4.2 mEq/L (ref 3.5–5.1)
SODIUM: 140 meq/L (ref 135–145)
TOTAL PROTEIN: 6.8 g/dL (ref 6.0–8.3)
Total Bilirubin: 0.3 mg/dL (ref 0.2–1.2)

## 2016-12-29 LAB — CBC WITH DIFFERENTIAL/PLATELET
Basophils Absolute: 0.1 10*3/uL (ref 0.0–0.1)
Basophils Relative: 1 % (ref 0.0–3.0)
EOS ABS: 0.1 10*3/uL (ref 0.0–0.7)
Eosinophils Relative: 1.7 % (ref 0.0–5.0)
HCT: 38.5 % (ref 36.0–46.0)
HEMOGLOBIN: 12.9 g/dL (ref 12.0–15.0)
Lymphocytes Relative: 26.9 % (ref 12.0–46.0)
Lymphs Abs: 2.1 10*3/uL (ref 0.7–4.0)
MCHC: 33.7 g/dL (ref 30.0–36.0)
MCV: 90.1 fl (ref 78.0–100.0)
MONO ABS: 0.3 10*3/uL (ref 0.1–1.0)
Monocytes Relative: 4.4 % (ref 3.0–12.0)
NEUTROS PCT: 66 % (ref 43.0–77.0)
Neutro Abs: 5.1 10*3/uL (ref 1.4–7.7)
Platelets: 279 10*3/uL (ref 150.0–400.0)
RBC: 4.27 Mil/uL (ref 3.87–5.11)
RDW: 12.7 % (ref 11.5–15.5)
WBC: 7.7 10*3/uL (ref 4.0–10.5)

## 2016-12-29 LAB — VITAMIN D 25 HYDROXY (VIT D DEFICIENCY, FRACTURES): VITD: 35.6 ng/mL (ref 30.00–100.00)

## 2016-12-29 NOTE — Patient Instructions (Addendum)
It was nice to see you today.  We discussed a lot of information today about multiple sclerosis and management options.   Please review the information provided and at your next visit we can decide together which medication would be best for you.  Today, we will check baseline labs for your white count, liver enzymes, and vitamin D level.    We will be starting steroid infusion for three days  I will see you back on September 13th at 2:30pm.

## 2017-01-02 ENCOUNTER — Other Ambulatory Visit (HOSPITAL_COMMUNITY): Payer: Self-pay | Admitting: *Deleted

## 2017-01-03 ENCOUNTER — Encounter (HOSPITAL_COMMUNITY)
Admission: RE | Admit: 2017-01-03 | Discharge: 2017-01-03 | Disposition: A | Discharge status: Home / Self Care | Payer: No Typology Code available for payment source | Source: Ambulatory Visit | Attending: Neurology | Admitting: Neurology

## 2017-01-03 DIAGNOSIS — G35 Multiple sclerosis: Secondary | ICD-10-CM | POA: Diagnosis not present

## 2017-01-03 MED ORDER — METHYLPREDNISOLONE SODIUM SUCC 1000 MG IJ SOLR: 1000.0000 mg | Freq: Every day | INTRAMUSCULAR | Status: DC

## 2017-01-03 MED ORDER — SODIUM CHLORIDE 0.9 % IV SOLN
1000.0000 mg | Freq: Every day | INTRAVENOUS | Status: DC
  Administered 2017-01-03: 09:00:00 1000 mg via INTRAVENOUS
  Filled 2017-01-03: qty 8

## 2017-01-04 ENCOUNTER — Encounter (HOSPITAL_COMMUNITY)
Admission: RE | Admit: 2017-01-04 | Discharge: 2017-01-04 | Disposition: A | Discharge status: Home / Self Care | Payer: No Typology Code available for payment source | Source: Ambulatory Visit | Attending: Neurology | Admitting: Neurology

## 2017-01-04 DIAGNOSIS — G35 Multiple sclerosis: Secondary | ICD-10-CM | POA: Diagnosis not present

## 2017-01-04 MED ORDER — SODIUM CHLORIDE 0.9 % IV SOLN
1000.0000 mg | Freq: Every day | INTRAVENOUS | Status: DC
  Administered 2017-01-04: 09:00:00 1000 mg via INTRAVENOUS
  Filled 2017-01-04: qty 8

## 2017-01-05 ENCOUNTER — Encounter (HOSPITAL_COMMUNITY)
Admission: RE | Admit: 2017-01-05 | Discharge: 2017-01-05 | Disposition: A | Discharge status: Home / Self Care | Payer: No Typology Code available for payment source | Source: Ambulatory Visit | Attending: Rheumatology | Admitting: Rheumatology

## 2017-01-05 DIAGNOSIS — G35 Multiple sclerosis: Secondary | ICD-10-CM | POA: Diagnosis not present

## 2017-01-05 MED ORDER — SODIUM CHLORIDE 0.9 % IV SOLN
1000.0000 mg | Freq: Every day | INTRAVENOUS | Status: DC
  Administered 2017-01-05: 1000 mg via INTRAVENOUS
  Filled 2017-01-05: qty 8

## 2017-01-22 ENCOUNTER — Other Ambulatory Visit: Payer: Self-pay | Admitting: Neurology

## 2017-01-22 LAB — OLIGOCLONAL BANDS, CSF + SERM: CSF OLIGOCLONAL BANDS: 152

## 2017-01-23 ENCOUNTER — Ambulatory Visit (INDEPENDENT_AMBULATORY_CARE_PROVIDER_SITE_OTHER): Payer: No Typology Code available for payment source | Admitting: Neurology

## 2017-01-23 ENCOUNTER — Encounter: Payer: Self-pay | Admitting: Neurology

## 2017-01-23 VITALS — BP 120/80 | HR 77 | Ht 65.0 in | Wt 136.4 lb

## 2017-01-23 DIAGNOSIS — G35 Multiple sclerosis: Secondary | ICD-10-CM

## 2017-01-23 NOTE — Patient Instructions (Addendum)
We will start the paperwork on Tecfidera  Follow-up with your eye doctor   Return to clinic in 6 months at which time we will recheck your labs

## 2017-01-23 NOTE — Progress Notes (Signed)
Follow-up Visit   Date: 01/23/17    TIANAH VANHOUTEN MRN: [id number] DOB: 1976-01-30   Interim History: Bridget Edwards is a 41 y.o. right-handed Caucasian female returning to the clinic for follow-up of new diagnosis of multiple sclerosis.  The patient was accompanied to the clinic by self.  History of present illness: Starting around 2015, she began having constant numbness over the tips of the toes and since September 2016 but this slowly improved until early 2018.  In late January, she began having the same sensation travelling up the legs to the level of the waist and worse on the right.  She has occasional sharp pain over the side of the feet, but this is very brief and seldom.  She has noticed that wearing shoes sometimes helps.  She has not noticed anything that alleviates her symptoms.  She notices the numbness more when she walks, but rest does not help. She denies imbalance, leg weakness, back pain, or bowel/bladder incontinence.    No history of alcohol abuse or diabetes. She saw her PCP for these symptoms who checked TSH, vitamin B12, folate, and electrolytes which returned normal.  She underwent electrodiagnostic testing of the legs which returned normal.   UPDATE 12/29/2016:   In May, she started feeling as if there was "extra light" in her eye and later developed right eye pain with eye movement.  Note from Dr. Santiago Bumpers at [facility] dated 11/21/2016 was received and shows right eye papillitis.  She was recommended to f/u with neurology and underwent MRI brain, cervical spine, and CSF testing which was consistent with multiple sclerosis and discussed today.  She continues to have mild right eye pain, tingling of the legs, and occasionally, sharp pain in her back with neck flexion.  She has not noticed worsening symptoms in the heat.  She denies any weakness, but has rare spells as if her left knee can feel like it could buckle.  UPDATE 01/23/2017:  Since her last visit,  she completed IVMP 1g x 3 days and noticed improvement of tingling of the feet and eye pain.  She continues to have some glare in her vision.  She has noticed that it takes less activity for her heart rate to beat faster, but denies chest pain pain or palpitations.  She occasionally has tingling across the chest especially when she is anxious.  No new neurological symptoms.  She has many questions about the disease, prognosis, and immunomodulatory therapies which we discussed.    Medications:  Current Outpatient Prescriptions on File Prior to Visit  Medication Sig Dispense Refill  . butalbital-acetaminophen-caffeine (FIORICET, ESGIC) 50-325-40 MG tablet Take 1 tablet by mouth every 6 (six) hours as needed for headache. 12 tablet 0  . ibuprofen (ADVIL,MOTRIN) 600 MG tablet ibuprofen 600 mg tablet    . MAGNESIUM PO Take by mouth.    Marland Kitchen PRENATAL VITAMINS PO Take 1 tablet by mouth daily.    Marland Kitchen VITAMIN D, ERGOCALCIFEROL, PO Take by mouth.     No current facility-administered medications on file prior to visit.     Allergies: No Known Allergies  Review of Systems:  CONSTITUTIONAL: No fevers, chills, night sweats, or weight loss.  EYES: No visual changes or eye pain ENT: No hearing changes.  No history of nose bleeds.   RESPIRATORY: No cough, wheezing and shortness of breath.   CARDIOVASCULAR: Negative for chest pain, and palpitations.   GI: Negative for abdominal discomfort, blood in stools or black stools.  No recent change in bowel habits.   GU:  No history of incontinence.   MUSCLOSKELETAL: No history of joint pain or swelling.  No myalgias.   SKIN: Negative for lesions, rash, and itching.   ENDOCRINE: Negative for cold or heat intolerance, polydipsia or goiter.   PSYCH:  No depression or anxiety symptoms.   NEURO: As Above.   Vital Signs:  BP 120/80   Pulse 77   Ht  (1.651 m)   Wt 136 lb 6 oz (61.9 kg)   SpO2 99%   BMI 22.69 kg/m   General:  Well appearing,  comfortable Neck:  No bruit CV: Regular, rate and rhythm Pulm:  Clear Ext:  No edema  Neurological Exam: MENTAL STATUS including orientation to time, place, person, recent and remote memory, attention span and concentration, language, and fund of knowledge is normal.  Speech is not dysarthric.  CRANIAL NERVES: Normal fundoscopic exam.  No visual field defects.  Pupils equal round and reactive to light.  Normal conjugate, extra-ocular eye movements in all directions of gaze.  No ptosis. Normal facial sensation.  Face is symmetric. Palate elevates symmetrically.  Tongue is midline.  MOTOR:  Motor strength is 5/5 in all extremities.  Tone is normal.    MSRs:  Reflexes are 2+/4 throughout.  SENSORY:  Intact to vibration and temperature.  COORDINATION/GAIT:  Normal finger-to- nose-finger and heel-to-shin.  Gait narrow based and stable.    Data: Labs 09/15/2014:  TSH 2.90, CBC and CMP is normal Labs 01/15/2015:  Vitamin B12 961, folate > 20  MRI cervical spine wwo contrast 12/11/2016:  Multifocal cord lesions highly likely to represent multiple sclerosis/ demyelinating disease. See above description.  MRI brain wwo contrast 12/11/2016:  Scattered small foci of T2 and FLAIR signal within the deep and subcortical hemispheric white matter, raising concern regarding the possibility of demyelinating disease/ multiple sclerosis. Old nonspecific gliotic white matter foci could have this appearance.  CSF 12/21/2016:  W8* (95% lymphocyte) R1  P37  G67, IgG index 0.7*, cytology increased mononuclear cells, flow cytometry no clonal B cells  IMPRESSION/PLAN: Relapsing-remitting multiple sclerosis (diagnosed 12/2016) with history of paresthesias since 2015 and papillitis in July 2018.  - MRI brain and cervical spine viewed with patient and shows scattered cortical white matte changes and multifocal short segments of the cervical cord.  - She was treated with IVMP 1 g x 3 days in August with improvement  -  Discussed disease modifying therapies for management of multiple sclerosis and we decided to start Tecfidera.  Risks and benefits were discussed, including rare side effect of PML  I have reviewed her baseline labs which are normal. Labs will be rechecked every 6 months  - No plans for future pregnancy.  - Continue vitamin D 4000 units daily  - She had many questions which were answered to the best of my ability  Return to clinic in 6 months, or sooner as needed  Thank you for allowing me to participate in patient's care.  If I can answer any additional questions, I would be pleased to do so.    Sincerely,    Donika K. Allena Katz, DO

## 2017-01-25 ENCOUNTER — Ambulatory Visit: Payer: No Typology Code available for payment source | Admitting: Neurology

## 2017-02-16 ENCOUNTER — Encounter: Payer: Self-pay | Admitting: Neurology

## 2017-02-26 ENCOUNTER — Encounter: Payer: Self-pay | Admitting: Neurology

## 2017-03-15 ENCOUNTER — Encounter: Payer: Self-pay | Admitting: Neurology

## 2017-05-21 ENCOUNTER — Telehealth: Payer: Self-pay | Admitting: Neurology

## 2017-05-21 ENCOUNTER — Encounter: Payer: Self-pay | Admitting: *Deleted

## 2017-05-21 NOTE — Telephone Encounter (Signed)
Clydie Braun with [employer] left a message regarding pt and would like a call back at [phone]

## 2017-05-21 NOTE — Telephone Encounter (Signed)
Called Bridget Edwards back and she said that they have been unable to contact patient.  I called patient and left message for her to contact [employer] and also sent a My Chart message.  [employer]: [phone]

## 2017-06-01 ENCOUNTER — Encounter: Payer: Self-pay | Admitting: Neurology

## 2017-06-12 ENCOUNTER — Other Ambulatory Visit: Payer: Self-pay | Admitting: *Deleted

## 2017-06-12 MED ORDER — DIMETHYL FUMARATE 240 MG PO CPDR: 240.0000 mg | DELAYED_RELEASE_CAPSULE | Freq: Every day | ORAL | 11 refills | Status: DC

## 2017-07-03 ENCOUNTER — Other Ambulatory Visit: Payer: Self-pay | Admitting: Internal Medicine

## 2017-07-03 ENCOUNTER — Ambulatory Visit
Admission: RE | Admit: 2017-07-03 | Discharge: 2017-07-03 | Disposition: A | Discharge status: Home / Self Care | Payer: No Typology Code available for payment source | Source: Ambulatory Visit | Attending: Internal Medicine | Admitting: Internal Medicine

## 2017-07-03 DIAGNOSIS — M545 Low back pain: Secondary | ICD-10-CM

## 2017-07-23 ENCOUNTER — Other Ambulatory Visit (INDEPENDENT_AMBULATORY_CARE_PROVIDER_SITE_OTHER): Payer: No Typology Code available for payment source

## 2017-07-23 ENCOUNTER — Encounter: Payer: Self-pay | Admitting: Neurology

## 2017-07-23 ENCOUNTER — Ambulatory Visit (INDEPENDENT_AMBULATORY_CARE_PROVIDER_SITE_OTHER): Payer: No Typology Code available for payment source | Admitting: Neurology

## 2017-07-23 VITALS — BP 90/70 | HR 93 | Ht 65.0 in | Wt 131.1 lb

## 2017-07-23 DIAGNOSIS — G35 Multiple sclerosis: Secondary | ICD-10-CM

## 2017-07-23 LAB — CBC WITH DIFFERENTIAL/PLATELET
BASOS ABS: 0 10*3/uL (ref 0.0–0.1)
Basophils Relative: 0.8 % (ref 0.0–3.0)
EOS ABS: 0.1 10*3/uL (ref 0.0–0.7)
Eosinophils Relative: 2.2 % (ref 0.0–5.0)
HEMATOCRIT: 38 % (ref 36.0–46.0)
HEMOGLOBIN: 13.1 g/dL (ref 12.0–15.0)
LYMPHS PCT: 18.7 % (ref 12.0–46.0)
Lymphs Abs: 1.1 10*3/uL (ref 0.7–4.0)
MCHC: 34.4 g/dL (ref 30.0–36.0)
MCV: 88.8 fl (ref 78.0–100.0)
MONOS PCT: 8.5 % (ref 3.0–12.0)
Monocytes Absolute: 0.5 10*3/uL (ref 0.1–1.0)
NEUTROS ABS: 4.1 10*3/uL (ref 1.4–7.7)
Neutrophils Relative %: 69.8 % (ref 43.0–77.0)
PLATELETS: 247 10*3/uL (ref 150.0–400.0)
RBC: 4.28 Mil/uL (ref 3.87–5.11)
RDW: 13.5 % (ref 11.5–15.5)
WBC: 5.8 10*3/uL (ref 4.0–10.5)

## 2017-07-23 LAB — COMPREHENSIVE METABOLIC PANEL
ALT: 11 U/L (ref 0–35)
AST: 10 U/L (ref 0–37)
Albumin: 5 g/dL (ref 3.5–5.2)
Alkaline Phosphatase: 35 U/L — ABNORMAL LOW (ref 39–117)
BILIRUBIN TOTAL: 0.5 mg/dL (ref 0.2–1.2)
BUN: 17 mg/dL (ref 6–23)
CALCIUM: 10 mg/dL (ref 8.4–10.5)
CO2: 28 meq/L (ref 19–32)
Chloride: 103 mEq/L (ref 96–112)
Creatinine, Ser: 0.68 mg/dL (ref 0.40–1.20)
GFR: 101.13 mL/min (ref >60.00)
Glucose, Bld: 100 mg/dL — ABNORMAL HIGH (ref 70–99)
Potassium: 3.8 mEq/L (ref 3.5–5.1)
Sodium: 141 mEq/L (ref 135–145)
TOTAL PROTEIN: 7.5 g/dL (ref 6.0–8.3)

## 2017-07-23 NOTE — Progress Notes (Signed)
Follow-up Visit   Date: 07/23/17    LY WASS MRN: [id number] DOB: 1975-07-19   Interim History: Bridget Edwards is a 42 y.o. right-handed Caucasian female returning to the clinic for follow-up of new diagnosis of multiple sclerosis.  The patient was accompanied to the clinic by self.  History of present illness: Starting around 2015, she began having constant numbness over the tips of the toes and since September 2016 but this slowly improved until early 2018.  In late January, she began having the same sensation travelling up the legs to the level of the waist and worse on the right.  She has occasional sharp pain over the side of the feet, but this is very brief and seldom.  She has noticed that wearing shoes sometimes helps.  She has not noticed anything that alleviates her symptoms.  She notices the numbness more when she walks, but rest does not help. She denies imbalance, leg weakness, back pain, or bowel/bladder incontinence.    No history of alcohol abuse or diabetes. She saw her PCP for these symptoms who checked TSH, vitamin B12, folate, and electrolytes which returned normal.  She underwent electrodiagnostic testing of the legs which returned normal.   UPDATE 12/29/2016:   In May, she started feeling as if there was "extra light" in her eye and later developed right eye pain with eye movement.  Note from Dr. Santiago Bumpers at [facility] dated 11/21/2016 was received and shows right eye papillitis.  She was recommended to f/u with neurology and underwent MRI brain, cervical spine, and CSF testing which was consistent with multiple sclerosis and discussed today.  She continues to have mild right eye pain, tingling of the legs, and occasionally, sharp pain in her back with neck flexion.  She has not noticed worsening symptoms in the heat.  She denies any weakness, but has rare spells as if her left knee can feel like it could buckle.  UPDATE 01/23/2017:  Since her last visit,  she completed IVMP 1g x 3 days and noticed improvement of tingling of the feet and eye pain.  She continues to have some glare in her vision.  She has noticed that it takes less activity for her heart rate to beat faster, but denies chest pain pain or palpitations.  She occasionally has tingling across the chest especially when she is anxious.  No new neurological symptoms.  She has many questions about the disease, prognosis, and immunomodulatory therapies which we discussed.   UPDATE 07/23/2017:  She is here for follow-up visit.  She is tolerating Tecfidera well and had only one episode of flushing last week, which lasted ~ 30 minutes.  She no longer has any eye pain, only occasional glare in both eyes.  She only rarely has spells of feet tingling. Over the last few weeks, she has developed low back pain and is taking meloxicam which helps. She has started [employer] for MS and has many questions regarding this.    Medications:  Current Outpatient Medications on File Prior to Visit  Medication Sig Dispense Refill  . Dimethyl Fumarate (TECFIDERA) 240 MG CPDR Take 1 capsule (240 mg total) by mouth daily. 60 capsule 11  . ibuprofen (ADVIL,MOTRIN) 600 MG tablet ibuprofen 600 mg tablet    . PRENATAL VITAMINS PO Take 1 tablet by mouth daily.    Marland Kitchen VITAMIN D, ERGOCALCIFEROL, PO Take by mouth.    Marland Kitchen MAGNESIUM PO Take by mouth.  No current facility-administered medications on file prior to visit.     Allergies: No Known Allergies  Review of Systems:  CONSTITUTIONAL: No fevers, chills, night sweats, or weight loss.  EYES: No visual changes or eye pain ENT: No hearing changes.  No history of nose bleeds.   RESPIRATORY: No cough, wheezing and shortness of breath.   CARDIOVASCULAR: Negative for chest pain, and palpitations.   GI: Negative for abdominal discomfort, blood in stools or black stools.  No recent change in bowel habits.   GU:  No history of incontinence.   MUSCLOSKELETAL: No history of  joint pain or swelling.  No myalgias.   SKIN: Negative for lesions, rash, and itching.   ENDOCRINE: Negative for cold or heat intolerance, polydipsia or goiter.   PSYCH:  No depression or anxiety symptoms.   NEURO: As Above.   Vital Signs:  BP 90/70   Pulse 93   Ht 5\' 5"  (1.651 m)   Wt 131 lb 2 oz (59.5 kg)   SpO2 99%   BMI 21.82 kg/m   General:  Well appearing, comfortable Neck:  No bruit CV: Regular, rate and rhythm Pulm:  Clear to auscultation Ext:  No edema  Neurological Exam: MENTAL STATUS including orientation to time, place, person, recent and remote memory, attention span and concentration, language, and fund of knowledge is normal.  Speech is not dysarthric.  CRANIAL NERVES: Normal fundoscopic exam.  No visual field defects.  Pupils equal round and reactive to light.  Normal conjugate, extra-ocular eye movements in all directions of gaze.  No ptosis. Normal facial sensation.  Face is symmetric. Palate elevates symmetrically.  Tongue is midline.  MOTOR:  Motor strength is 5/5 in all extremities.  Tone is normal.    MSRs:  Reflexes are 2+/4 throughout.  SENSORY:  Intact to vibration and temperature.  COORDINATION/GAIT:  Normal finger-to- nose-finger and heel-to-shin.  Gait narrow based and stable.    Data: Labs 09/15/2014:  TSH 2.90, CBC and CMP is normal Labs 01/15/2015:  Vitamin B12 961, folate > 20  MRI cervical spine wwo contrast 12/11/2016:  Multifocal cord lesions highly likely to represent multiple sclerosis/ demyelinating disease. See above description.  MRI brain wwo contrast 12/11/2016:  Scattered small foci of T2 and FLAIR signal within the deep and subcortical hemispheric white matter, raising concern regarding the possibility of demyelinating disease/ multiple sclerosis. Old nonspecific gliotic white matter foci could have this appearance.  CSF 12/21/2016:  W8* (95% lymphocyte) R1  P37  G67, IgG index 0.7*, cytology increased mononuclear cells, flow cytometry  no clonal B cells  IMPRESSION/PLAN: Relapsing-remitting multiple sclerosis (diagnosed 12/2016) with history of feet paresthesias since 2015 and papillitis in July 2018.  Imaging confirmed diagnosis showing scattered cortical white matter changes and multifocal short segments of the cervical cord.  She was treated with IVMP in August 2018 which improved right eye papillitis and started on Tecifdera in the fall of 2018. Continue vitamin D 4000 units.  Today, we will check CBC with diff and CMP.  Prior to her next visit, she will have repeat MRI brain and cervical spine wwo contrast for annual surveillance imaging.   For her low back pain, recommend that she start low back strengthening exercises and continue meloxicam as needed.  Return to clinic in 5 months  Greater than 50% of this 25 minute visit was spent in counseling, explanation of diagnosis, planning of further management, and coordination of care.  Thank you for allowing me to participate in patient's  care.  If I can answer any additional questions, I would be pleased to do so.    Sincerely,    Donika K. Posey Pronto, DO

## 2017-07-23 NOTE — Patient Instructions (Addendum)
Continue your medications as you are taking  Start a yoga or low back strengthening exercises  Check labs  Send my office a message in July so we can order your MRI brain and cervical spine  Return to clinic in August 2019

## 2017-11-15 ENCOUNTER — Encounter: Payer: Self-pay | Admitting: Neurology

## 2017-11-15 DIAGNOSIS — G35 Multiple sclerosis: Secondary | ICD-10-CM

## 2017-11-27 ENCOUNTER — Telehealth: Payer: Self-pay | Admitting: *Deleted

## 2017-11-27 NOTE — Telephone Encounter (Signed)
[facility] has been given the PA number.

## 2017-11-27 NOTE — Telephone Encounter (Signed)
-----   Message from Silvio Pate, [place] sent at 11/27/2017 12:54 PM EDT ----- Regarding: FW: Berkley Harvey   ----- Message ----- From: Tawni Levy I Sent: 11/27/2017  11:39 AM To: Silvio Pate, CMA Subject: RE: Berkley Harvey                                       [id number] auth valid from 07/16 until 10/14,...em ----- Message ----- From: Silvio Pate, CMA Sent: 11/26/2017   3:24 PM To: Tim Lair Charlett Merkle, LPN Subject: Barbette Merino at [facility] left me a vm asking for authorization for patient's MR's scheduled for this Friday.   This is a Dr. Allena Katz patient. (why I am copying you Morrie Sheldon) Es, if you could check into it that would be great. Thanks!  Lesly Rubenstein

## 2017-11-30 ENCOUNTER — Ambulatory Visit
Admission: RE | Admit: 2017-11-30 | Discharge: 2017-11-30 | Disposition: A | Discharge status: Home / Self Care | Payer: No Typology Code available for payment source | Source: Ambulatory Visit | Attending: Neurology | Admitting: Neurology

## 2017-11-30 DIAGNOSIS — G35 Multiple sclerosis: Secondary | ICD-10-CM

## 2017-11-30 MED ORDER — GADOBENATE DIMEGLUMINE 529 MG/ML IV SOLN
12.0000 mL | Freq: Once | INTRAVENOUS | Status: AC | PRN
  Administered 2017-11-30: 12 mL via INTRAVENOUS

## 2017-12-03 ENCOUNTER — Telehealth: Payer: Self-pay | Admitting: *Deleted

## 2017-12-03 NOTE — Telephone Encounter (Signed)
Patient given results

## 2017-12-03 NOTE — Telephone Encounter (Signed)
-----   Message from Glendale Chard, DO sent at 11/30/2017  2:27 PM EDT ----- Please inform patient that her MRI brain and cervical spine is stable, there are no new areas of abnormalities or progression of MS.  The old lesions are also less apparent. Overall, looks good. Thanks.

## 2017-12-03 NOTE — Telephone Encounter (Signed)
Left message notifying patient of results.

## 2017-12-24 ENCOUNTER — Ambulatory Visit (INDEPENDENT_AMBULATORY_CARE_PROVIDER_SITE_OTHER): Payer: No Typology Code available for payment source | Admitting: Neurology

## 2017-12-24 ENCOUNTER — Other Ambulatory Visit (INDEPENDENT_AMBULATORY_CARE_PROVIDER_SITE_OTHER): Payer: No Typology Code available for payment source

## 2017-12-24 ENCOUNTER — Encounter: Payer: Self-pay | Admitting: Neurology

## 2017-12-24 VITALS — BP 110/70 | HR 87 | Ht 65.0 in | Wt 133.1 lb

## 2017-12-24 DIAGNOSIS — G35 Multiple sclerosis: Secondary | ICD-10-CM

## 2017-12-24 LAB — CBC WITH DIFFERENTIAL/PLATELET
BASOS ABS: 0 10*3/uL (ref 0.0–0.1)
BASOS PCT: 0.9 % (ref 0.0–3.0)
EOS ABS: 0 10*3/uL (ref 0.0–0.7)
Eosinophils Relative: 1 % (ref 0.0–5.0)
HCT: 38.1 % (ref 36.0–46.0)
Hemoglobin: 13.4 g/dL (ref 12.0–15.0)
LYMPHS ABS: 0.6 10*3/uL — AB (ref 0.7–4.0)
Lymphocytes Relative: 13.3 % (ref 12.0–46.0)
MCHC: 35.1 g/dL (ref 30.0–36.0)
MCV: 89.4 fl (ref 78.0–100.0)
MONO ABS: 0.4 10*3/uL (ref 0.1–1.0)
Monocytes Relative: 8.6 % (ref 3.0–12.0)
NEUTROS ABS: 3.6 10*3/uL (ref 1.4–7.7)
NEUTROS PCT: 76.2 % (ref 43.0–77.0)
Platelets: 248 10*3/uL (ref 150.0–400.0)
RBC: 4.26 Mil/uL (ref 3.87–5.11)
RDW: 12.9 % (ref 11.5–15.5)
WBC: 4.7 10*3/uL (ref 4.0–10.5)

## 2017-12-24 LAB — COMPREHENSIVE METABOLIC PANEL
ALT: 13 U/L (ref 0–35)
AST: 12 U/L (ref 0–37)
Albumin: 4.9 g/dL (ref 3.5–5.2)
Alkaline Phosphatase: 38 U/L — ABNORMAL LOW (ref 39–117)
BILIRUBIN TOTAL: 0.6 mg/dL (ref 0.2–1.2)
BUN: 15 mg/dL (ref 6–23)
CHLORIDE: 103 meq/L (ref 96–112)
CO2: 28 meq/L (ref 19–32)
CREATININE: 0.79 mg/dL (ref 0.40–1.20)
Calcium: 10.1 mg/dL (ref 8.4–10.5)
GFR: 84.89 mL/min (ref >60.00)
GLUCOSE: 101 mg/dL — AB (ref 70–99)
Potassium: 4.5 mEq/L (ref 3.5–5.1)
SODIUM: 138 meq/L (ref 135–145)
Total Protein: 7.7 g/dL (ref 6.0–8.3)

## 2017-12-24 NOTE — Patient Instructions (Signed)
Check labs   Continue medications as you are taking  Return to clinic in 6 months  

## 2017-12-24 NOTE — Progress Notes (Signed)
Follow-up Visit   Date: 12/24/17    Bridget Edwards MRN: [id number] DOB: Aug 20, 1975   Interim History: Bridget Edwards is a 42 y.o. right-handed Caucasian female returning to the clinic for follow-up of new diagnosis of multiple sclerosis.  The patient was accompanied to the clinic by husband.  History of present illness: Starting around 2015, she began having constant numbness over the tips of the toes and since September 2016 but this slowly improved until early 2018.  In late January 2018, she began having the same sensation travelling up the legs to the level of the waist and worse on the right.  She underwent electrodiagnostic testing of the legs which returned normal.   In May 2018, she started feeling as if there was "extra light" in her eye and later developed right eye pain with eye movement.  Note from Dr. Santiago Bumpers at [facility] dated 11/21/2016 was received and shows right eye papillitis.  She underwent MRI brain, cervical spine, and CSF testing which was consistent with multiple sclerosis.  In August 2018, she completed IVMP 1g x 3 days and noticed improvement of tingling of the feet and eye pain.  In the fall of 2018, she was started on Tecfidera.   UPDATE 12/24/2017:  She is here for follow-up visit.  She occasionally has warmth sensation in the legs, but this is fleeting.  No new vision changes, numbness/tingling, or weakness.  Yesterday, she was having a slight sore throat, no fever or cough.  She is complaint with her Tecfidera and has noticed less flushing especially since reducing peanuts from her diet.    Medications:  Current Outpatient Medications on File Prior to Visit  Medication Sig Dispense Refill  . Dimethyl Fumarate (TECFIDERA) 240 MG CPDR Take 1 capsule (240 mg total) by mouth daily. 60 capsule 11  . ibuprofen (ADVIL,MOTRIN) 600 MG tablet ibuprofen 600 mg tablet    . MAGNESIUM PO Take by mouth.    . ondansetron (ZOFRAN-ODT) 8 MG disintegrating tablet  ondansetron 8 mg disintegrating tablet    . PRENATAL VITAMINS PO Take 1 tablet by mouth daily.    Marland Kitchen VITAMIN D, ERGOCALCIFEROL, PO Take by mouth.     No current facility-administered medications on file prior to visit.     Allergies: No Known Allergies  Review of Systems:  CONSTITUTIONAL: No fevers, chills, night sweats, or weight loss.  EYES: No visual changes or eye pain ENT: No hearing changes.  No history of nose bleeds.   RESPIRATORY: No cough, wheezing and shortness of breath.   CARDIOVASCULAR: Negative for chest pain, and palpitations.   GI: Negative for abdominal discomfort, blood in stools or black stools.  No recent change in bowel habits.   GU:  No history of incontinence.   MUSCLOSKELETAL: No history of joint pain or swelling.  No myalgias.   SKIN: Negative for lesions, rash, and itching.   ENDOCRINE: Negative for cold or heat intolerance, polydipsia or goiter.   PSYCH:  No depression or anxiety symptoms.   NEURO: As Above.   Vital Signs:  BP 110/70   Pulse 87   Ht 5\' 5"  (1.651 m)   Wt 133 lb 2 oz (60.4 kg)   SpO2 98%   BMI 22.15 kg/m   General Medical Exam:   General:  Well appearing, comfortable  Eyes/ENT: see cranial nerve examination.   Neck: No masses appreciated.  Full range of motion without tenderness.  No carotid bruits. Respiratory:  Clear to auscultation,  good air entry bilaterally.   Cardiac:  Regular rate and rhythm, no murmur.   Ext:  No edema  Neurological Exam: MENTAL STATUS including orientation to time, place, person, recent and remote memory, attention span and concentration, language, and fund of knowledge is normal.  Speech is not dysarthric.  CRANIAL NERVES: Normal fundoscopic exam.  No visual field defects. Pupils equal round and reactive to light.  Normal conjugate, extra-ocular eye movements in all directions of gaze.  No ptosis. Normal facial sensation.  Face is symmetric. Palate elevates symmetrically.  Tongue is midline.  MOTOR:   Motor strength is 5/5 in all extremities  No atrophy, fasciculations or abnormal movements.  No pronator drift.  Tone is normal.    MSRs:  Reflexes are 2+/4 throughout.  SENSORY:  Intact to vibration and pin prick throughout.  COORDINATION/GAIT:  Normal finger-to- nose-finger.  Intact rapid alternating movements bilaterally.  Gait narrow based and stable.    Data: Labs 09/15/2014:  TSH 2.90, CBC and CMP is normal Labs 01/15/2015:  Vitamin B12 961, folate > 20  MRI cervical spine wwo contrast 12/11/2016:  Multifocal cord lesions highly likely to represent multiple sclerosis/ demyelinating disease. See above description.  MRI brain wwo contrast 12/11/2016:  Scattered small foci of T2 and FLAIR signal within the deep and subcortical hemispheric white matter, raising concern regarding the possibility of demyelinating disease/ multiple sclerosis. Old nonspecific gliotic white matter foci could have this appearance.  CSF 12/21/2016:  W8* (95% lymphocyte) R1  P37  G67, IgG index 0.7*, cytology increased mononuclear cells, flow cytometry no clonal B cells  MRI cervical spine wwo contrast 11/30/2017: Multifocal areas of increased T2 and FLAIR signal within the cord which are less conspicuous when compared to last year's exam. These are most notably present at C2-3, C6 and T1-2. No new or progressive lesions. No contrast enhancement.  Stable mid cervical spondylosis.  MRI brain 11/30/2017: Cerebral hemispheres again show multiple scattered foci of T2 and FLAIR signal within the deep and subcortical white matter. In general, these are slightly less conspicuous, possibly due to technical differences. I do not see any new or progressive lesions. No lesions show abnormal contrast enhancement.   IMPRESSION/PLAN: Relapsing-remitting multiple sclerosis (diagnosed 12/2016) with history of feet paresthesias since 2015 and papillitis in July 2018.  Imaging confirmed diagnosis showing scattered cortical white matter  changes and multifocal short segments of the cervical cord.  She was treated with IVMP in August 2018 which improved right eye papillitis and started on Tecifdera in the fall of 2018.   Her imaging from July was personally viewed and shows no evidence of disease activity, there is chronic disease burden involving the subcortical white matter and cervical cord. Fortunately, she remains asymptomatic and exam is relatively normal.   Continue Tecfidera 240mg  twice daily.  Check CBC and CMP Continue vitamin D 4000 units.    Return to clinic 6 months   Thank you for allowing me to participate in patient's care.  If I can answer any additional questions, I would be pleased to do so.    Sincerely,    Tirzah Fross K. Allena Katz, DO

## 2017-12-24 NOTE — Progress Notes (Signed)
110/70

## 2018-01-17 ENCOUNTER — Encounter: Payer: Self-pay | Admitting: Neurology

## 2018-01-18 ENCOUNTER — Other Ambulatory Visit: Payer: Self-pay | Admitting: Neurology

## 2018-06-29 NOTE — Progress Notes (Signed)
Follow-up Visit   Date: 07/01/18    Bridget Edwards MRN: [id number] DOB: 10/24/1975   Interim History: Bridget Edwards is a 43 y.o. right-handed Caucasian female returning to the clinic for follow-up of new diagnosis of multiple sclerosis.  The patient was accompanied to the clinic by husband.  History of present illness: Starting around 2015, she began having constant numbness over the tips of the toes and since September 2016 but this slowly improved until early 2018.  In late January 2018, she began having the same sensation travelling up the legs to the level of the waist and worse on the right.  She underwent electrodiagnostic testing of the legs which returned normal.   In May 2018, she started feeling as if there was "extra light" in her eye and later developed right eye pain with eye movement.  Note from Dr. Santiago Bumpers at [facility] dated 11/21/2016 was received and shows right eye papillitis.  She underwent MRI brain, cervical spine, and CSF testing which was consistent with multiple sclerosis.  In August 2018, she completed IVMP 1g x 3 days and noticed improvement of tingling of the feet and eye pain.  In the fall of 2018, she was started on Tecfidera.   UPDATE 07/01/2018: She is here for follow-up visit. She is doing well on Tecfidera and uses aspirin as needed for flushing.  She denies any vision changes, numbness/tingling, or weakness.  Her son has been sick with influenza B and then became sick again over the weekend and diagnosed with influenza A.  She is concerned about getting any illnesses and especially appropriate treatment, should she develop a fever.   Medications:  Current Outpatient Medications on File Prior to Visit  Medication Sig Dispense Refill  . aspirin 325 MG tablet Take 325 mg by mouth every 6 (six) hours as needed.    Marland Kitchen ibuprofen (ADVIL,MOTRIN) 600 MG tablet ibuprofen 600 mg tablet    . MAGNESIUM PO Take by mouth.    . ondansetron (ZOFRAN-ODT) 8 MG  disintegrating tablet ondansetron 8 mg disintegrating tablet    . PRENATAL VITAMINS PO Take 1 tablet by mouth daily.    . TECFIDERA 240 MG CPDR TAKE ONE CAPSULE (240 MG) BY MOUTH TWICE DAILY. STORE IN ORIGINAL CONTAINER AT ROOM TEMPERATURE. 180 capsule 2  . VITAMIN D, ERGOCALCIFEROL, PO Take by mouth.     No current facility-administered medications on file prior to visit.     Allergies: No Known Allergies  Review of Systems:  CONSTITUTIONAL: No fevers, chills, night sweats, or weight loss.  EYES: No visual changes or eye pain ENT: No hearing changes.  No history of nose bleeds.   RESPIRATORY: No cough, wheezing and shortness of breath.   CARDIOVASCULAR: Negative for chest pain, and palpitations.   GI: Negative for abdominal discomfort, blood in stools or black stools.  No recent change in bowel habits.   GU:  No history of incontinence.   MUSCLOSKELETAL: No history of joint pain or swelling.  No myalgias.   SKIN: Negative for lesions, rash, and itching.   ENDOCRINE: Negative for cold or heat intolerance, polydipsia or goiter.   PSYCH:  No depression or anxiety symptoms.   NEURO: As Above.   Vital Signs:  BP 120/70   Pulse 80   Ht 5\' 5"  (1.651 m)   Wt 137 lb 2 oz (62.2 kg)   SpO2 98%   BMI 22.82 kg/m   General Medical Exam:    General:  Well appearing, comfortable  Eyes/ENT: see cranial nerve examination.   Neck: No masses appreciated.  Full range of motion without tenderness.  No carotid bruits. Respiratory:  Clear to auscultation, good air entry bilaterally.   Cardiac:  Regular rate and rhythm, no murmur.   Ext:  No edema   Neurological Exam: MENTAL STATUS including orientation to time, place, person, recent and remote memory, attention span and concentration, language, and fund of knowledge is normal.  Speech is not dysarthric.  CRANIAL NERVES:    No visual field defects. Pupils equal round and reactive to light.  Normal conjugate, extra-ocular eye movements in all  directions of gaze.  No ptosis. Normal facial sensation.  Face is symmetric. Palate elevates symmetrically.  Tongue is midline.  MOTOR:  Motor strength is 5/5 in all extremities  No atrophy, fasciculations or abnormal movements.  No pronator drift.  Tone is normal.    MSRs:  Reflexes are 2+/4 throughout.  SENSORY:  Intact to vibration throughout.  COORDINATION/GAIT:  Normal finger-to- nose-finger.  Gait narrow based and stable.    Data: Labs 09/15/2014:  TSH 2.90, CBC and CMP is normal Labs 01/15/2015:  Vitamin B12 961, folate > 20  MRI cervical spine wwo contrast 12/11/2016:  Multifocal cord lesions highly likely to represent multiple sclerosis/ demyelinating disease. See above description.  MRI brain wwo contrast 12/11/2016:  Scattered small foci of T2 and FLAIR signal within the deep and subcortical hemispheric white matter, raising concern regarding the possibility of demyelinating disease/ multiple sclerosis. Old nonspecific gliotic white matter foci could have this appearance.  CSF 12/21/2016:  W8* (95% lymphocyte) R1  P37  G67, IgG index 0.7*, cytology increased mononuclear cells, flow cytometry no clonal B cells  MRI cervical spine wwo contrast 11/30/2017:  Multifocal areas of increased T2 and FLAIR signal within the cord which are less conspicuous when compared to last year's exam. These are most notably present at C2-3, C6 and T1-2. No new or progressive lesions. No contrast enhancement.  Stable mid cervical spondylosis.  MRI brain 11/30/2017:  Cerebral hemispheres again show multiple scattered foci of T2 and FLAIR signal within the deep and subcortical white matter. In general, these are slightly less conspicuous, possibly due to technical differences. I do not see any new or progressive lesions. No lesions show abnormal contrast enhancement.   Lab Results  Component Value Date   ALT 13 12/24/2017   AST 12 12/24/2017   ALKPHOS 38 (L) 12/24/2017   BILITOT 0.6 12/24/2017   Lab Results    Component Value Date   CREATININE 0.79 12/24/2017   BUN 15 12/24/2017   NA 138 12/24/2017   K 4.5 12/24/2017   CL 103 12/24/2017   CO2 28 12/24/2017   Lab Results  Component Value Date   WBC 4.7 12/24/2017   HGB 13.4 12/24/2017   HCT 38.1 12/24/2017   MCV 89.4 12/24/2017   PLT 248.0 12/24/2017     IMPRESSION/PLAN: 1.  Relapsing remitting multiple sclerosis (diagnosed 12/2016) and with history of feet paresthesias since 2015) colitis in July 2018.  MRI brain and cervical spine showed scattered cortical white matter changes and multifocal short segments in the cervical cord.  CSF testing was consistent with inflammatory changes.  She was treated with corticosteroids and August 2018, which improved right eye papillitis.  Disease modifying therapy with Tecfidera was started in the fall 2018.  Surveillance imaging from 2019 showed stable disease, no new lesions. Clinically, she is doing very well and remains asymptomatic. She  is doing well on Tecfidera 240 mg twice daily, which will be continued.  OK to use aspirin as needed for flushing  I will check CMP and CBC Continue vitamin D 4000 units.   Recommend annual eye exam Repeat MRI brain and cervical spine wwo contrast in summer 2020   2.  Preventative health for infectious diseases discussed.  I encouraged frequent hand washing.  If she develops fever, she should be evaluated.  She is concerned about PML manifesting with fever and I reassured her that PML is very rare condition and which presents with neurological symptoms, in additional to fever.     Return to clinic in 9 months  Thank you for allowing me to participate in patient's care.  If I can answer any additional questions, I would be pleased to do so.    Sincerely,    Donika K. Allena Katz, DO

## 2018-07-01 ENCOUNTER — Encounter: Payer: Self-pay | Admitting: Neurology

## 2018-07-01 ENCOUNTER — Other Ambulatory Visit (INDEPENDENT_AMBULATORY_CARE_PROVIDER_SITE_OTHER): Payer: No Typology Code available for payment source

## 2018-07-01 ENCOUNTER — Ambulatory Visit: Payer: No Typology Code available for payment source | Admitting: Neurology

## 2018-07-01 VITALS — BP 120/70 | HR 80 | Ht 65.0 in | Wt 137.1 lb

## 2018-07-01 DIAGNOSIS — G35 Multiple sclerosis: Secondary | ICD-10-CM | POA: Diagnosis not present

## 2018-07-01 DIAGNOSIS — Z79899 Other long term (current) drug therapy: Secondary | ICD-10-CM | POA: Diagnosis not present

## 2018-07-01 LAB — COMPREHENSIVE METABOLIC PANEL
ALT: 10 U/L (ref 0–35)
AST: 13 U/L (ref 0–37)
Albumin: 4.7 g/dL (ref 3.5–5.2)
Alkaline Phosphatase: 38 U/L — ABNORMAL LOW (ref 39–117)
BUN: 17 mg/dL (ref 6–23)
CO2: 27 mEq/L (ref 19–32)
Calcium: 9.3 mg/dL (ref 8.4–10.5)
Chloride: 103 mEq/L (ref 96–112)
Creatinine, Ser: 0.66 mg/dL (ref 0.40–1.20)
GFR: 98.04 mL/min (ref >60.00)
GLUCOSE: 97 mg/dL (ref 70–99)
POTASSIUM: 3.6 meq/L (ref 3.5–5.1)
Sodium: 140 mEq/L (ref 135–145)
TOTAL PROTEIN: 6.9 g/dL (ref 6.0–8.3)
Total Bilirubin: 0.5 mg/dL (ref 0.2–1.2)

## 2018-07-01 LAB — CBC WITH DIFFERENTIAL/PLATELET
Basophils Absolute: 0 10*3/uL (ref 0.0–0.1)
Basophils Relative: 0.8 % (ref 0.0–3.0)
EOS PCT: 2.4 % (ref 0.0–5.0)
Eosinophils Absolute: 0.1 10*3/uL (ref 0.0–0.7)
HCT: 36.6 % (ref 36.0–46.0)
Hemoglobin: 12.5 g/dL (ref 12.0–15.0)
Lymphocytes Relative: 15.3 % (ref 12.0–46.0)
Lymphs Abs: 0.6 10*3/uL — ABNORMAL LOW (ref 0.7–4.0)
MCHC: 34.3 g/dL (ref 30.0–36.0)
MCV: 89.4 fl (ref 78.0–100.0)
Monocytes Absolute: 0.3 10*3/uL (ref 0.1–1.0)
Monocytes Relative: 7.3 % (ref 3.0–12.0)
Neutro Abs: 3.1 10*3/uL (ref 1.4–7.7)
Neutrophils Relative %: 74.2 % (ref 43.0–77.0)
Platelets: 233 10*3/uL (ref 150.0–400.0)
RBC: 4.09 Mil/uL (ref 3.87–5.11)
RDW: 12.7 % (ref 11.5–15.5)
WBC: 4.1 10*3/uL (ref 4.0–10.5)

## 2018-07-01 NOTE — Patient Instructions (Signed)
Check labs  MRI brain and cervical spine wwo contrast will be ordered for July 2020  Return to clinic in August 2020

## 2018-07-16 NOTE — Telephone Encounter (Signed)
Now that there is a confirmed case of coronavirus in [place], I called patient today and discussed her concerns/questions on the phone.  She was informed that the cases will most likely increase and the best way to prevent spread is to avoid wash hands regularly and avoid sick contacts.  I have advised her to monitor the news for travel advisories, as well as check online at [employer], as we are learning more about the spread of the virus on a daily basis.  Should she develop flu-like symptoms, recommend being tested for influenza as this is still very common. All questions were answered.  Teauna Dubach K. Allena Katz, DO

## 2018-07-29 ENCOUNTER — Encounter: Payer: Self-pay | Admitting: *Deleted

## 2018-08-05 ENCOUNTER — Other Ambulatory Visit: Payer: Self-pay | Admitting: *Deleted

## 2018-08-05 MED ORDER — TECFIDERA 240 MG PO CPDR: DELAYED_RELEASE_CAPSULE | ORAL | 3 refills | Status: DC

## 2018-11-26 IMAGING — MR MR CERVICAL SPINE WO/W CM
17 of 19 series · 42 of 48 positions shown · IV contrast (multihance)
Comparison: None.

CLINICAL DATA: Numbness in both legs, right worse than left.
Symptoms of 2 months duration.

EXAM:
MRI CERVICAL SPINE WITHOUT AND WITH CONTRAST
TECHNIQUE: Multiplanar and multiecho pulse sequences of the cervical spine, to
include the craniocervical junction and cervicothoracic junction,
were obtained without and with intravenous contrast.
CONTRAST:  13mL MULTIHANCE GADOBENATE DIMEGLUMINE 529 MG/ML IV SOLN

[Series 2: T1 · sagittal · 5.0mm · 0.45mm/px · 1 of 21 slices shown (1 of 3)]
[im 1/21]
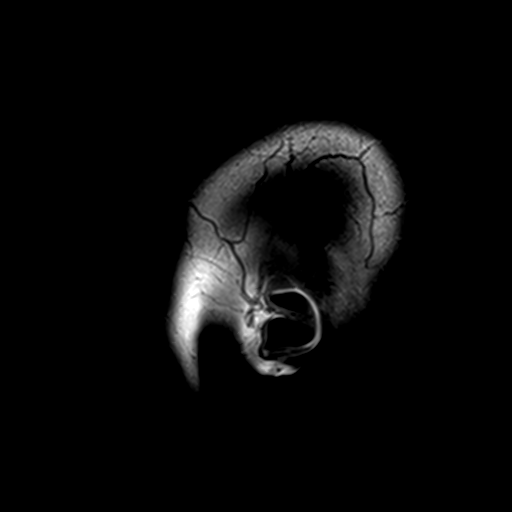

[Series 3: DWI · axial · 3.0mm · 1.80mm/px · z∈[-21,+126]mm · 5 of 99 slices shown (1 of 2)]
[im 1/99]
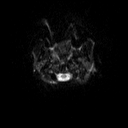
[im 25/99]
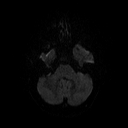
[im 50/99]
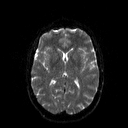
[im 74/99]
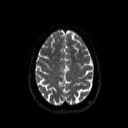
[im 99/99]
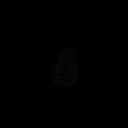

[Series 4: DWI · axial · 3.0mm · 1.80mm/px · z∈[-21,+126]mm · 3 of 50 slices shown (2 of 2)]
[im 1/50]
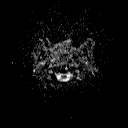
[im 25/50]
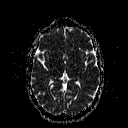
[im 50/50]
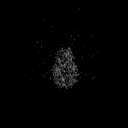

[Series 5: T2 · axial · 5.0mm · 0.51mm/px · 1 of 22 slices shown (1 of 5)]
[im 1/22]
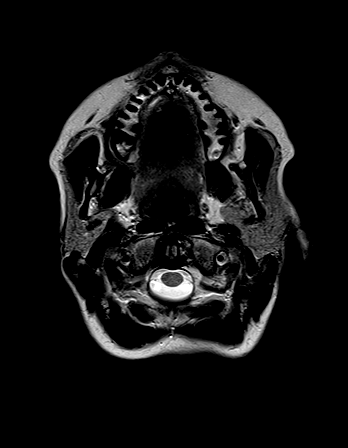

[Series 6: FLAIR · axial · 3.0mm · 0.45mm/px · z∈[-15,+120]mm · 2 of 30 slices shown]
[im 1/30]
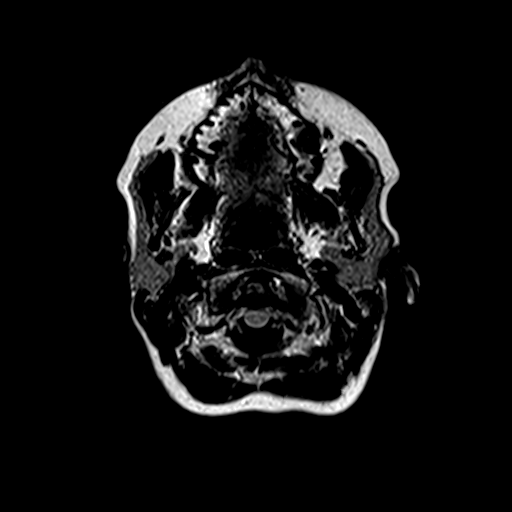
[im 30/30]
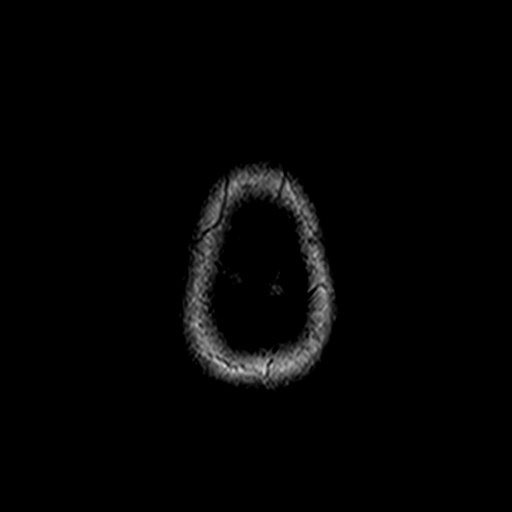

[Series 7: mip_images(sw) · axial · 40.0mm · 0.90mm/px · 1 of 23 slices shown]
[im 1/23]
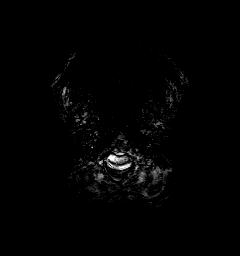

[Series 8: swi_images · axial · 5.0mm · 0.90mm/px · z∈[-21,+124]mm · 2 of 30 slices shown]
[im 1/30]
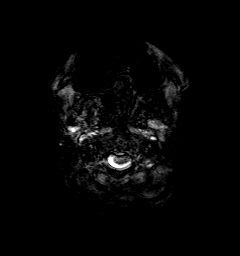
[im 30/30]
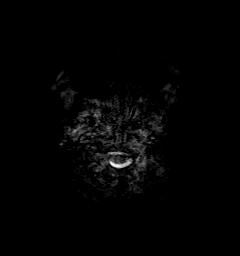

[Series 9: t1_mpr_tra · axial · 1.0mm · 0.75mm/px · z∈[-20,+123]mm · 8 of 144 slices shown (1 of 2)]
[im 1/144]
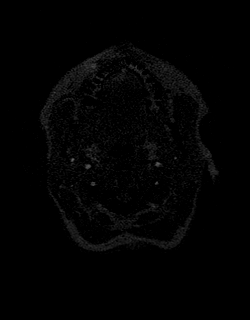
[im 18/144]
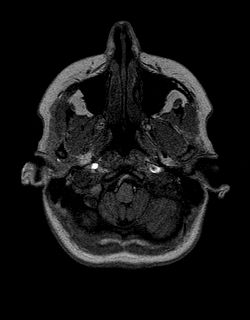
[im 36/144]
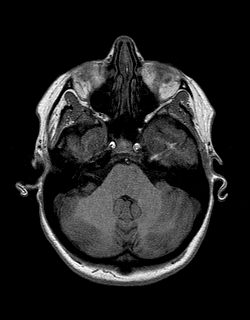
[im 54/144]
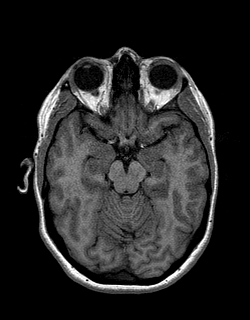
[im 90/144]
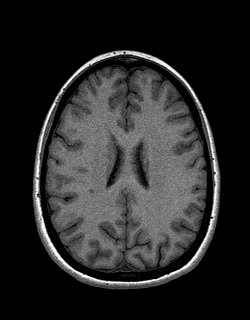
[im 108/144]
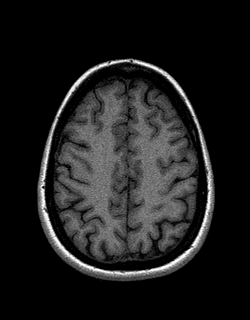
[im 126/144]
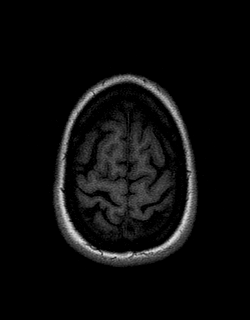
[im 144/144]
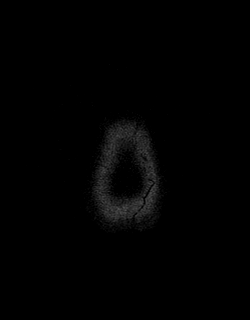

[Series 10: STIR · sagittal · 3.0mm · 0.82mm/px · 1 of 12 slices shown]
[im 1/12]
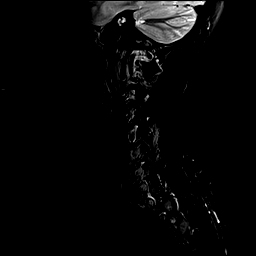

[Series 11: T1 · sagittal · 3.0mm · 0.41mm/px · 1 of 12 slices shown (2 of 3)]
[im 1/12]
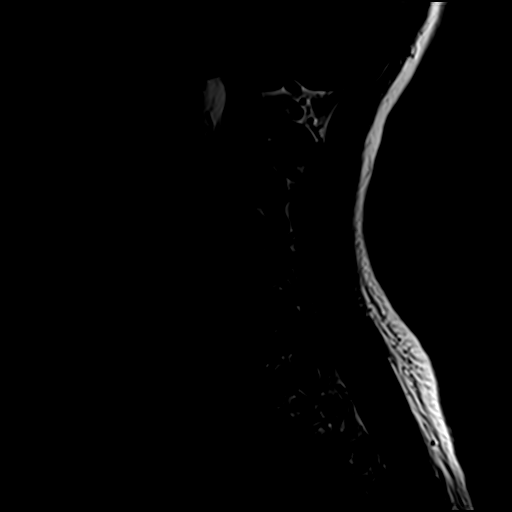

[Series 12: T2 · axial · 3.0mm · 0.39mm/px · z∈[-156,-63]mm · 2 of 26 slices shown (2 of 5)]
[im 1/26]
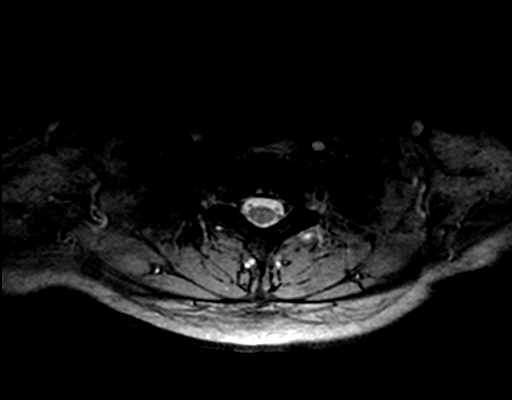
[im 26/26]
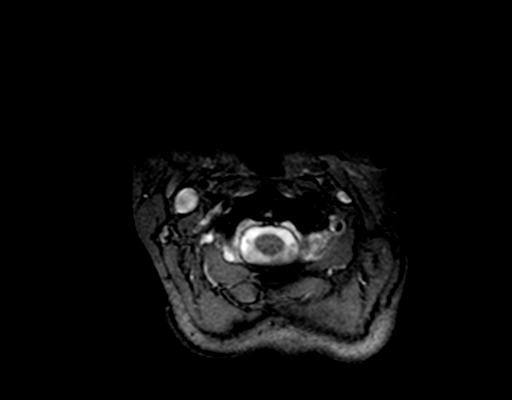

[Series 13: T2 · axial · 3.0mm · 0.39mm/px · z∈[-157,-63]mm · 2 of 26 slices shown (3 of 5)]
[im 1/26]
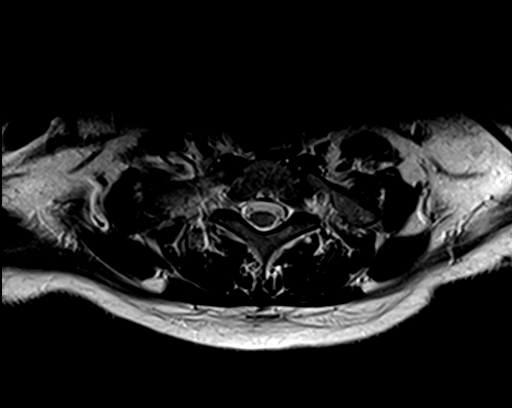
[im 26/26]
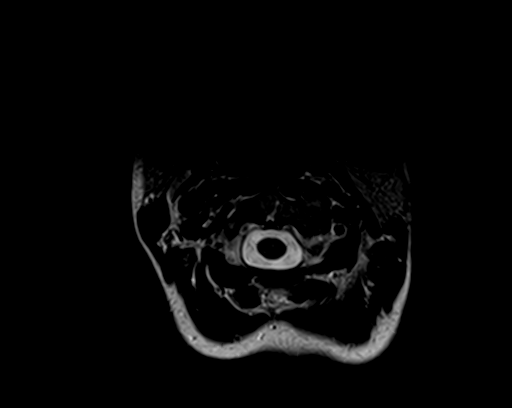

[Series 14: T1 · axial · 3.0mm · 0.35mm/px · z∈[-159,-66]mm · 2 of 26 slices shown (3 of 3)]
[im 1/26]
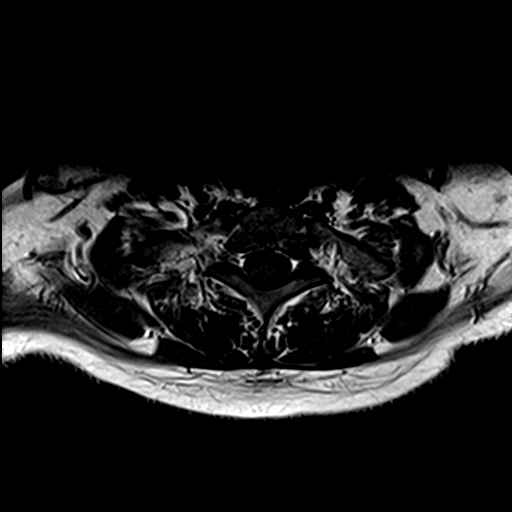
[im 26/26]
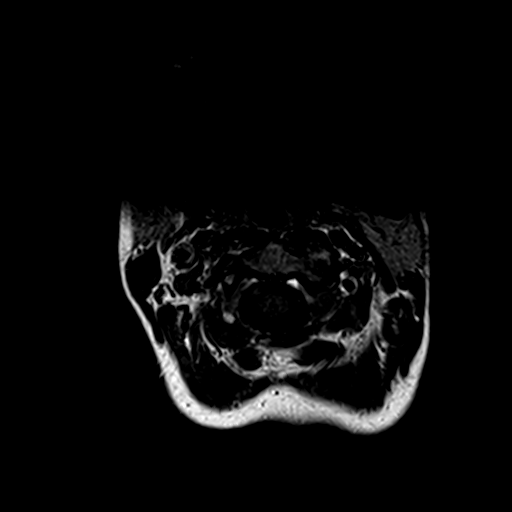

[Series 15: T2 · coronal · 5.0mm · 0.45mm/px · 2 of 25 slices shown (4 of 5)]
[im 1/25]
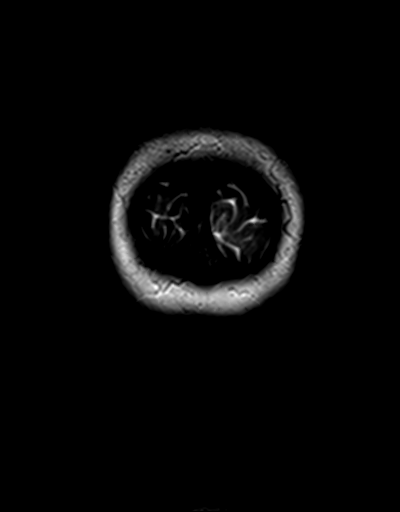
[im 25/25]
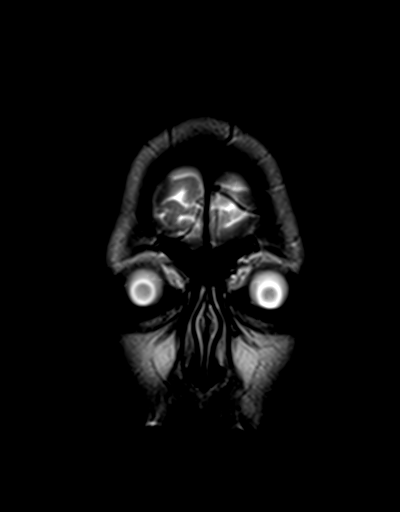

[Series 16: t1_mpr_tra · axial · 1.0mm · 0.75mm/px · z∈[-20,+105]mm · 7 of 144 slices shown (2 of 2)]
[im 1/144]
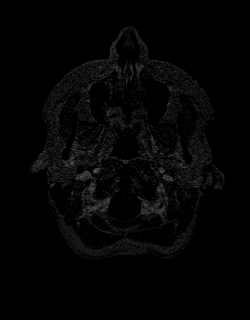
[im 18/144]
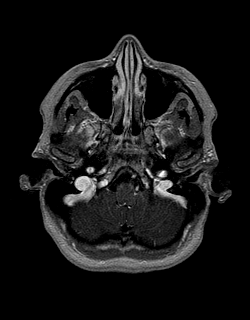
[im 36/144]
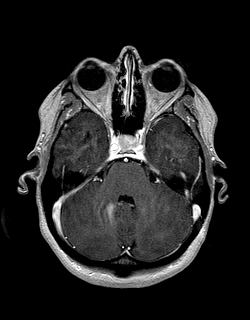
[im 54/144]
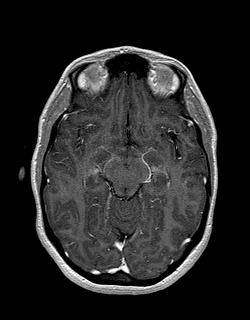
[im 90/144]
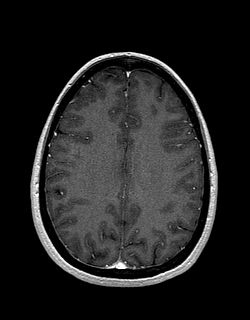
[im 108/144]
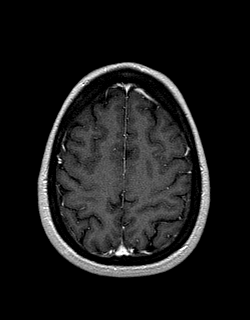
[im 126/144]
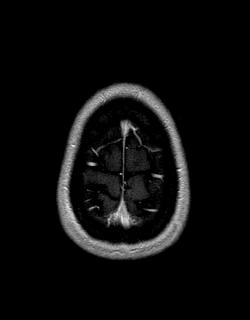

[Series 19: T2 · sagittal · 3.0mm · 0.41mm/px · 1 of 12 slices shown (5 of 5)]
[im 1/12]
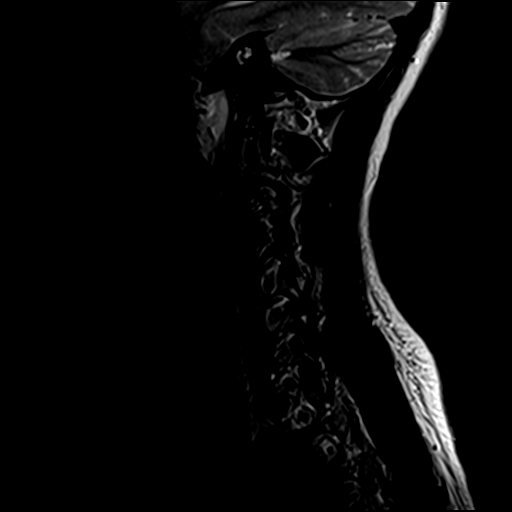

[Series 20: T1 fat-sat post-contrast · sagittal · 3.0mm · 0.82mm/px · 1 of 12 slices shown]
[im 1/12]
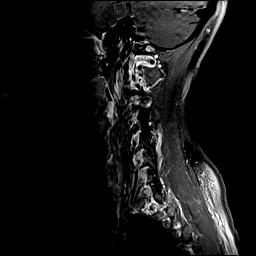

[42 of 48 positions shown; findings below may reference images not displayed]

FINDINGS: Alignment: Normal

Vertebrae: Normal

Cord: Multifocal T2 lesions within the cervical and upper thoracic
cord in the dorsal midline most notable at C2-3, C5-6 and T1-2. The
T1-2 lesion shows peripheral contrast enhancement.

Posterior Fossa, vertebral arteries, paraspinal tissues: Negative

Disc levels:

Ordinary degenerative spondylosis at C4-5, C5-6 and C6-7. Mild
osteophytic encroachment by osteophytes, most pronounced on the
right at C5-6.
IMPRESSION: Multifocal cord lesions highly likely to represent multiple
sclerosis/ demyelinating disease. See above description.

## 2018-11-27 ENCOUNTER — Other Ambulatory Visit: Payer: Self-pay

## 2018-11-27 DIAGNOSIS — G35 Multiple sclerosis: Secondary | ICD-10-CM

## 2019-01-02 ENCOUNTER — Encounter: Payer: Self-pay | Admitting: Neurology

## 2019-01-07 NOTE — Progress Notes (Signed)
   Virtual Visit via Video Note The purpose of this virtual visit is to provide medical care while limiting exposure to the novel coronavirus.    Consent was obtained for video visit:  Yes.   Answered questions that patient had about telehealth interaction:  Yes.   I discussed the limitations, risks, security and privacy concerns of performing an evaluation and management service by telemedicine. I also discussed with the patient that there may be a patient responsible charge related to this service. The patient expressed understanding and agreed to proceed.  Pt location: Home Physician Location: office Name of referring provider:  Seward Carol, MD I connected with Bridget Edwards at patients initiation/request on 01/08/2019 at  7:50 AM EDT by video enabled telemedicine application and verified that I am speaking with the correct person using two identifiers. Pt MRN:  [id number] Pt DOB:  07/18/1975 Video Participants:  Bridget Edwards   History of Present Illness: This is a 43 y.o. female returning for follow-up of relapsing-remitting multiple sclerosis.  She has been doing well from a neurological standpoint, and denies any new complaints of numbness/tingling, weakness, or vision changes.  She has been taking extra caution during the COVID-19 pandemic to keep her and her family safe.  She is due for annual surveillance imaging, however she would like to hold off on this until the pandemic is under better control.  She continues to be on Tecfidera 240 mg twice daily and is tolerating this well.   Observations/Objective:   Vitals:   01/02/19 1029  Weight: 134 lb (60.8 kg)  Height: 5\' 5"  (1.651 m)   Patient is awake, alert, and appears comfortable.  Oriented x 4.   Extraocular muscles are intact. No ptosis.  Face is symmetric.  Speech is not dysarthric. Tongue is midline. Antigravity in all extremities.  No pronator drift. Gait appears normal.   Assessment and Plan:  Relapsing remitting  multiple sclerosis (diagnosed 12/2016) manifesting with bilateral feet paresthesias and right eye papillitis.  Disease burden involves cortical white matter and multifocal short segments on cervical cord.  CSF testing shows inflammatory changes.  She was treated with IVMP in August 2018, which improved papillitis.  Tecfidera was started in fall 2018 and she has been tolerating this well.    Clinically, she remains asymptomatic and is doing well. MRI brain and cervical spine wwo contrast has been ordered; she would like to wait a few months prior to going into the imaging center due to the current pandemic.  I am okay with this decision as she remains asymptomatic. I will also check CBC and CMP on the same date as her MRI.  She will call to notify my office of her appointment time and date. Continue Tecfidera 240mg  twice daily Continue vitamin D 4000U daily   Follow Up Instructions:   I discussed the assessment and treatment plan with the patient. The patient was provided an opportunity to ask questions and all were answered. The patient agreed with the plan and demonstrated an understanding of the instructions.   The patient was advised to call back or seek an in-person evaluation if the symptoms worsen or if the condition fails to improve as anticipated.  Follow-up in 6 months  Total time spent:  15 minutes    Bridget Edwards K. Posey Pronto, DO

## 2019-01-08 ENCOUNTER — Other Ambulatory Visit: Payer: Self-pay

## 2019-01-08 ENCOUNTER — Telehealth (INDEPENDENT_AMBULATORY_CARE_PROVIDER_SITE_OTHER): Payer: No Typology Code available for payment source | Admitting: Neurology

## 2019-01-08 VITALS — Ht 65.0 in | Wt 134.0 lb

## 2019-01-08 DIAGNOSIS — G35 Multiple sclerosis: Secondary | ICD-10-CM

## 2019-01-08 DIAGNOSIS — Z79899 Other long term (current) drug therapy: Secondary | ICD-10-CM | POA: Diagnosis not present

## 2019-01-23 ENCOUNTER — Encounter: Payer: Self-pay | Admitting: *Deleted

## 2019-01-23 NOTE — Progress Notes (Signed)
[place] (Key: [id number]) Tecfidera 240MG  dr capsules   Form GEHA Electronic PA Form Created 11 minutes ago Sent to Plan 1 minute ago Determination Wait for Questions [employer] GEHA NCPDP typically responds with questions in less than 15 minutes, but may take up to 24 hours.

## 2019-01-24 NOTE — Progress Notes (Signed)
[place] (Key: [id number])  Tecfidera 240MG  dr capsules  Form GEHA Electronic PA Form  Created  1 day ago  Sent to Plan  1 day ago  Plan Response  20 hours ago  Submit Clinical Questions  less than a minute ago  Determination  Wait for Determination Please wait for [place] to return a determination.

## 2019-02-04 ENCOUNTER — Encounter: Payer: Self-pay | Admitting: Neurology

## 2019-02-12 NOTE — Progress Notes (Signed)
An appeal was sent by Dr. Lenoria Farrier (Key: [id number]) Tecfidera 240MG  dr capsules   Form GEHA Electronic PA Form Created 20 days ago Sent to Plan 20 days ago Plan Response 20 days ago Submit Clinical Questions 19 days ago Determination Unfavorable 15 days ago Message from [employer] Your PA request has been denied. Additional information will be provided in the denial communication. (Message 1140) Your PA request has been denied. Additional information will be provided in the denial communication. (Message 1140)

## 2019-02-13 NOTE — Telephone Encounter (Signed)
Called [employer] department. Spoke with Rachel Moulds. He said it was started again as a new request instead of an appeal. I asked why the appeal was not processed and he did not know why. He said a clinician spoke with Dr. Posey Pronto and went over the criteria questions which were entered on a new PA (by [employer]). It is under review   I asked for the turnaround time because we needed some answers. He said he will review by this afternoon and get back to [place] with a response of approval or denial by the end of the day.

## 2019-02-21 ENCOUNTER — Encounter: Payer: Self-pay | Admitting: Neurology

## 2019-02-27 ENCOUNTER — Telehealth: Payer: Self-pay | Admitting: Neurology

## 2019-02-27 NOTE — Telephone Encounter (Signed)
Dr. Posey Pronto have you seen anything for her to sign? I have not, thanks

## 2019-02-27 NOTE — Telephone Encounter (Signed)
I have not received anything. Please contact her insurance company and ask them to fax whatever paperwork is necessary.  I don't want her to be out of medication.  Thanks.

## 2019-02-27 NOTE — Telephone Encounter (Signed)
Pt states that she is calling to check the status of the getting the tecfidera approved by ins she states that her insurance company told her that they sent [place] something to for Auth and she would need to sign it

## 2019-02-27 NOTE — Telephone Encounter (Signed)
Pt notified and patient will contact insurance, if anything else is needed she will call back

## 2019-03-03 ENCOUNTER — Telehealth: Payer: Self-pay | Admitting: Neurology

## 2019-03-03 NOTE — Telephone Encounter (Signed)
[facility] is calling about the Tecfidera medication and asking about the appeal. Wanting to know if it was approved or denied. Thanks!

## 2019-03-05 ENCOUNTER — Telehealth: Payer: Self-pay | Admitting: Neurology

## 2019-03-05 NOTE — Telephone Encounter (Signed)
Trying to get her insurance number now left message to confirm

## 2019-03-05 NOTE — Telephone Encounter (Signed)
Contacted pts insurance [phone], had to start another process for approval, Information verbally given over the phone, will have response in 24 hours . Marked Urgent.

## 2019-03-05 NOTE — Telephone Encounter (Signed)
Left message for Bridget Edwards to  Call office back

## 2019-03-05 NOTE — Telephone Encounter (Signed)
Patient returning your call.  Thanks.

## 2019-03-06 ENCOUNTER — Telehealth: Payer: Self-pay

## 2019-03-06 NOTE — Telephone Encounter (Signed)
Prior authoirzation PA#GEHA-standard option [id number] JD approved fro 03/05/2019-03/04/2020

## 2019-03-18 ENCOUNTER — Telehealth: Payer: Self-pay | Admitting: Neurology

## 2019-03-18 NOTE — Telephone Encounter (Signed)
Yes, ok to take generic, please have them fax the request.

## 2019-03-18 NOTE — Telephone Encounter (Signed)
Pharm calling in about the Tecfidera medication - they are out of the generic,  insurance is charging large copay for the brand name. Pharm is calling to see if Dr. Posey Pronto is comfortable with generic and then they would order it. She said she would fax over the request but wanted to make sure you got this msg. Thanks!

## 2019-03-18 NOTE — Telephone Encounter (Signed)
I called [facility] at [phone], verbal given to give generic and fax to come over to have signed.

## 2019-03-18 NOTE — Telephone Encounter (Signed)
Please see message and advise 

## 2019-03-21 NOTE — Telephone Encounter (Signed)
Janett Billow from [place] called and stated she will send the fax this afternoon. If there are any questions please call her back at her extension as noted. FYI only.

## 2019-03-24 ENCOUNTER — Telehealth: Payer: Self-pay | Admitting: Neurology

## 2019-03-24 NOTE — Telephone Encounter (Signed)
Patient does not want to do generic, her main concern, is responsible for her copayment, she has been getting it free.

## 2019-03-24 NOTE — Telephone Encounter (Signed)
Patient is calling in about the tecfidera medication- she has some questions about a letter she received with the insurance. She said she heard from the pharm too and she is a little confused. Thanks!

## 2019-03-24 NOTE — Telephone Encounter (Signed)
Now that tecfidera is available in generic formulation, the pharmaceutical company probably does not offer copay assistance.  She can contact [employer] patient assistance program [phone]) and see if she will qualify for any aide.

## 2019-03-24 NOTE — Telephone Encounter (Signed)
Patient notified. To contact [place].

## 2019-05-21 ENCOUNTER — Other Ambulatory Visit: Payer: Self-pay

## 2019-05-21 ENCOUNTER — Telehealth: Payer: Self-pay | Admitting: Neurology

## 2019-05-21 MED ORDER — TECFIDERA 240 MG PO CPDR: DELAYED_RELEASE_CAPSULE | ORAL | 3 refills | Status: DC

## 2019-05-21 NOTE — Telephone Encounter (Signed)
Patient has new insurance and needs a new prescription sent into the new pharmacy below for Tecfidera 240 MG in three month increments.  [facility] [phone]

## 2019-05-29 NOTE — Telephone Encounter (Signed)
Prescribing Provider Encounter Provider  Glendale Chard, DO Dayna Ramus, LPN  Outpatient Medication Detail   Disp Refills Start End   TECFIDERA 240 MG CPDR 180 capsule 3 05/21/2019    Sig: TAKE ONE CAPSULE (240 MG) BY MOUTH TWICE DAILY. STORE IN ORIGINAL CONTAINER AT ROOM TEMPERATURE.   Sent to pharmacy as: TECFIDERA 240 MG Capsule Delayed Release   E-Prescribing Status: Receipt confirmed by pharmacy (05/21/2019  9:08 AM EST)

## 2019-05-29 NOTE — Telephone Encounter (Signed)
Has this been completed?  Sending to clinical staff for review: Okay to sign/close encounter or is further follow up needed? 

## 2019-05-30 ENCOUNTER — Telehealth: Payer: Self-pay | Admitting: Neurology

## 2019-05-30 NOTE — Telephone Encounter (Signed)
Patient called in needing to have a Prior Auth on her Medication. She said that the Pharmacy was faxing over a request. Thank you

## 2019-06-04 NOTE — Telephone Encounter (Signed)
Darl Pikes, can you look into this?  I completed the paperwork last week.  Thanks.

## 2019-06-05 ENCOUNTER — Telehealth: Payer: Self-pay | Admitting: *Deleted

## 2019-06-05 NOTE — Telephone Encounter (Signed)
Relayed results from phone call to [facility] [phone]. They said they received a PA request and it was incomplete so they faxed it to [place] and we returned it still not totally completed. So I proceeded to do it over the phone with an agent Morrie Sheldon. She then transferred me to a pharmacist also Morrie Sheldon and she denied it because Kalle has not tried 1 each of the generic and preferred meds ([place], [place], etc)  I said she has been on Tecfidera successfully for years and it is unreasonable to ask her to start changing it now. Plus she does not want to start trying other meds when this one is working well. They said the options are to try and fail the generic for (can't remember how many months they said), then try and fail at least one of their "preferred meds" or to do a peer review with the medical director. I said to do a peer review she asked when to call and gave your hours to be reached as follows: call around noon or after 4pm (any day of the week, except Thursday). I asked to schedule this and that option is not available.

## 2019-06-13 ENCOUNTER — Telehealth: Payer: Self-pay | Admitting: Neurology

## 2019-06-13 NOTE — Telephone Encounter (Signed)
Patient is calling in wanting some info about her PA that was supposedly being worked on. It was denied and then there was supposed to be a peer to peer and she hasn't heard anything since. Do we have an update for her on this? Thanks!

## 2019-06-13 NOTE — Telephone Encounter (Signed)
I called Caryle to let her know that we still have not been contacted to do the peer to peer. I did ask one of the people I spoke with to please give [place] an idea. I told her that our patient has been successfully on this medication for years and now this lack of continuity of care is on the verge of being detrimental to our patient. She said she will try to get the medical director to call Dr. Allena Katz either Friday or Monday (06/13/2019 or 06/16/2019). I left notes at the front desk to make sure they keep them on the line until they get a hold of Dr. Allena Katz. Shamira said she has been in contact with [employer] and they told her they may be able to help in the interim getting her some drug and also they have a program that may work if the peer to peer rules unfavorable for her.

## 2019-06-20 NOTE — Telephone Encounter (Signed)
Received a call from Taleen for an update, I told her I will contact the insurance's appeal department to see if there is one and the conversation below was relayed to her when I returned her call.  I also verified she will not run out of meds for 3 weeks because [employer] is sending her some complimentary drug to hold her over.  I also asked if she would like to speak to Dr. Posey Pronto about other options such as trying the new formulation or the generic and she does not want to discuss this at this time. She said her copay was not affordable. She contacted [employer] and they have a different financial help option she can enroll in if this is denied but the denial must be documented to start the process.  Today I called [phone] [facility] clinical call center option 2 then option 0. Spoke with Randall Hiss asked if there was a more direct number to call for appeals updates he said no.  I asked about the status of the review. He said it is still pending. He said they looked at it 2 days ago and I said yes because I called 2 days ago. He said they will reach out soon. I tried to get him to commit to a more definite date and he did not. He reiterated "status states still pending- being reviewed- will reach out soon". I requested an identifier for the phone call, he said they do not do that just the patient's ID.   Message Received: 1 week ago Message Contents  Alda Berthold, DO  Azalee Course  Thank you for all your efforts. Hopefully, we can get a patient an answer by Monday.       Previous Messages   ----- Message -----  From: Azalee Course  Sent: 06/12/2019 10:40 AM EST  To: Alda Berthold, DO   I called again today and the pharmacist I spoke with said she will reach out to the "medical director" and try and get a call back to [place] tomorrow or Monday. I will let the front desk know to try and reach you when they call.  I told the pharmacist that it is simply this patient has had coverage before  changing insurances and has successfully taken this medication and now with this new insurance she is late taking her medication and has been calling and we need to help her that it is not fair to her that her continuation of treatment has been so delayed due to them wanting to change a treatment that has already been established with her.  ----- Message -----  From: Alda Berthold, DO  Sent: 06/11/2019  2:57 PM EST  To: Azalee Course   I was actually just thinking of her. No one has contacted me about her peer-to-peer. Sometimes, I have better luck, when the peer-to-peer is scheduled, can you see if they are willing to schedule it with me - any day at 4pm, except Thursday).  ----- Message -----  From: Azalee Course  Sent: 06/11/2019  2:38 PM EST  To: Alda Berthold, DO   Geral called and left a message asking where we were. I wanted to touch base with you before I called her back. I gave the insurance our number and best time to call you to do a peer to peer. Have they tried to contact you?   To refresh she has been on Tecfidera for quite some time and now they  want to make her try their preferred alternative meds so the last step in the appeal is for you to do a peer to peer. I think she has changed insurance companies and the new one is the one wanting her to try all these other meds.

## 2019-06-20 NOTE — Telephone Encounter (Signed)
Noted  

## 2019-06-27 ENCOUNTER — Telehealth: Payer: Self-pay | Admitting: *Deleted

## 2019-06-27 NOTE — Telephone Encounter (Signed)
I called Bridget Edwards to let her know we received another denial letter after Dr. Allena Katz completed the peer to peer call earlier this week. It was the exact same form letter, no identifiers nothing to distinguish it from the other denial letters. I told her I will call [employer] who will help her get the medication. I then called [place] our contact [phone] and she gave me the following information. Bridget Edwards will be the Patient Services contact person and her fax# is [phone]. Her phone is [phone] and she asked that I give this number to Bridget Edwards but Bridget Edwards will contact her after she call us to gather some information.

## 2019-07-01 ENCOUNTER — Telehealth: Payer: Self-pay | Admitting: Neurology

## 2019-07-01 NOTE — Telephone Encounter (Signed)
Please advise, I know Darl Pikes had been working with Dr Allena Katz on this

## 2019-07-01 NOTE — Telephone Encounter (Signed)
Patient called and requested an update on the appeal on her Tecfidera 240 MG. She said it was denied but [employer] doesn't have the most recent PA rejection to work with.  [employer]

## 2019-07-07 ENCOUNTER — Encounter: Payer: Self-pay | Admitting: Neurology

## 2019-07-08 ENCOUNTER — Other Ambulatory Visit: Payer: Self-pay

## 2019-07-08 ENCOUNTER — Other Ambulatory Visit (INDEPENDENT_AMBULATORY_CARE_PROVIDER_SITE_OTHER): Payer: Federal, State, Local not specified - PPO

## 2019-07-08 DIAGNOSIS — G35 Multiple sclerosis: Secondary | ICD-10-CM | POA: Diagnosis not present

## 2019-07-09 ENCOUNTER — Telehealth: Payer: No Typology Code available for payment source | Admitting: Neurology

## 2019-07-09 LAB — CBC WITH DIFFERENTIAL/PLATELET
Absolute Monocytes: 371 cells/uL (ref 200–950)
Basophils Absolute: 19 cells/uL (ref 0–200)
Basophils Relative: 0.3 %
Eosinophils Absolute: 128 cells/uL (ref 15–500)
Eosinophils Relative: 2 %
HCT: 37.9 % (ref 35.0–45.0)
Hemoglobin: 12.9 g/dL (ref 11.7–15.5)
Lymphs Abs: 704 cells/uL — ABNORMAL LOW (ref 850–3900)
MCH: 31.2 pg (ref 27.0–33.0)
MCHC: 34 g/dL (ref 32.0–36.0)
MCV: 91.8 fL (ref 80.0–100.0)
MPV: 10.8 fL (ref 7.5–12.5)
Monocytes Relative: 5.8 %
Neutro Abs: 5178 cells/uL (ref 1500–7800)
Neutrophils Relative %: 80.9 %
Platelets: 260 10*3/uL (ref 140–400)
RBC: 4.13 10*6/uL (ref 3.80–5.10)
RDW: 12.4 % (ref 11.0–15.0)
Total Lymphocyte: 11 %
WBC: 6.4 10*3/uL (ref 3.8–10.8)

## 2019-07-09 LAB — COMPLETE METABOLIC PANEL WITH GFR
AG Ratio: 2 (calc) (ref 1.0–2.5)
ALT: 11 U/L (ref 6–29)
AST: 11 U/L (ref 10–30)
Albumin: 4.8 g/dL (ref 3.6–5.1)
Alkaline phosphatase (APISO): 40 U/L (ref 31–125)
BUN: 15 mg/dL (ref 7–25)
CO2: 25 mmol/L (ref 20–32)
Calcium: 9.6 mg/dL (ref 8.6–10.2)
Chloride: 103 mmol/L (ref 98–110)
Creat: 0.71 mg/dL (ref 0.50–1.10)
GFR, Est African American: 121 mL/min/{1.73_m2} (ref >=60)
GFR, Est Non African American: 104 mL/min/{1.73_m2} (ref >=60)
Globulin: 2.4 g/dL (calc) (ref 1.9–3.7)
Glucose, Bld: 80 mg/dL (ref 65–99)
Potassium: 4.3 mmol/L (ref 3.5–5.3)
Sodium: 139 mmol/L (ref 135–146)
Total Bilirubin: 0.6 mg/dL (ref 0.2–1.2)
Total Protein: 7.2 g/dL (ref 6.1–8.1)

## 2019-07-09 NOTE — Progress Notes (Signed)
Virtual Visit via Video Note The purpose of this virtual visit is to provide medical care while limiting exposure to the novel coronavirus.    Consent was obtained for video visit:  Yes.   Answered questions that patient had about telehealth interaction:  Yes.   I discussed the limitations, risks, security and privacy concerns of performing an evaluation and management service by telemedicine. I also discussed with the patient that there may be a patient responsible charge related to this service. The patient expressed understanding and agreed to proceed.  Pt location: Home Physician Location: office Name of referring provider:  Renford Dills, MD I connected with Bridget Edwards at patients initiation/request on 07/10/2019 at  7:50 AM EST by video enabled telemedicine application and verified that I am speaking with the correct person using two identifiers. Pt MRN:  [id number] Pt DOB:  [birth year] Video Participants:  Bridget Edwards   History of Present Illness: This is a 43 y.o. female returning for follow-up of relapsing-remitting multiple sclerosis.  She has been doing well from a multiple sclerosis perspective.  She denies any new vision changes, numbness, tingling, or weakness..  The biggest challenge has been trying to get Tecfidera approved, despite many hours of work getting prior authorizations and appeals done, there has been no success.  At this point, her online account appears that appeal is pending, my office has not received any updates.  She tells me that if she has 2 denials, then she may qualify for Tecfidera financial assistance program through May, after which this will no longer be offered.  If this is the case, our options would be to try generic dimethyl fumarate or alternative medication. Surveillance imaging of the brain was delayed due to COVID19 pandemic.  She would like to wait until she is fully vaccinated prior to having her MRI.   Observations/Objective:   There  were no vitals filed for this visit. Patient is awake, alert, and appears comfortable.  Oriented x 4.   Extraocular muscles are intact. No ptosis.  Face is symmetric.  Speech is not dysarthric. Tongue is midline. Antigravity in all extremities.  No pronator drift. Gait appears normal   Lab Results  Component Value Date   ALT 11 07/08/2019   AST 11 07/08/2019   ALKPHOS 38 (L) 07/01/2018   BILITOT 0.6 07/08/2019   Lab Results  Component Value Date   WBC 6.4 07/08/2019   HGB 12.9 07/08/2019   HCT 37.9 07/08/2019   MCV 91.8 07/08/2019   PLT 260 07/08/2019    Assessment and Plan:  Relpasing remitting multiple sclerosis (diagnosed 12/2016) manifesting with bilateral feet paresthesias and right eye papillitis. Clinically, she is stable without ongoing or new neurological symptoms.  Prior imaging from 2019 shows disease burden involving the cortical white matter and cervical cord.  She was started on Tecfidera in the fall of 2018 and has been tolerating this well with no relapses.  We have attempted to keep patient on Tecfidera due to disease stability, but insurance has denied this.  If there is no clear answer regarding her Tecfidera by mid-March, then generic dimethyl fumarate will likely need to be started.  I discussed that if she has intolerance to generic formulation, to let my office know and we can try to appeal for Tecfidera again.  Other disease modifying therapies were briefly discussed, and mutually decided to try generic dimethyl fumarate first. She is due for MRI brain and cervical spine wwo contrast and would like to  wait until she is fully vaccinated to schedule testing. Recent CBC abd CMP reviewed and is stable Continue vitamin D 4000U daily  Follow Up Instructions:   I discussed the assessment and treatment plan with the patient. The patient was provided an opportunity to ask questions and all were answered. The patient agreed with the plan and demonstrated an understanding  of the instructions.   The patient was advised to call back or seek an in-person evaluation if the symptoms worsen or if the condition fails to improve as anticipated.  Follow-up in 6 months   Alda Berthold, DO

## 2019-07-10 ENCOUNTER — Telehealth (INDEPENDENT_AMBULATORY_CARE_PROVIDER_SITE_OTHER): Payer: Federal, State, Local not specified - PPO | Admitting: Neurology

## 2019-07-10 ENCOUNTER — Other Ambulatory Visit: Payer: Self-pay

## 2019-07-10 DIAGNOSIS — Z79899 Other long term (current) drug therapy: Secondary | ICD-10-CM | POA: Diagnosis not present

## 2019-07-10 DIAGNOSIS — G35 Multiple sclerosis: Secondary | ICD-10-CM | POA: Diagnosis not present

## 2019-07-18 ENCOUNTER — Encounter: Payer: Self-pay | Admitting: Neurology

## 2019-07-18 NOTE — Telephone Encounter (Signed)
Has this been completed?  Sending to clinical staff for review: Okay to sign/close encounter or is further follow up needed? 

## 2019-07-21 NOTE — Telephone Encounter (Addendum)
On 07/17/2019 Sharnay called and asked me to fax all the recent lab values, test results and clinic visit notes to [phone] for her appeal. These were faxed today along with a letter of appeal requested from Dr. Allena Katz. Task ID # [id number] for appeal

## 2019-07-31 ENCOUNTER — Encounter: Payer: Self-pay | Admitting: *Deleted

## 2019-07-31 NOTE — Progress Notes (Signed)
Received another denial letter from [facility] [employer] regarding the last appeal. The appeal was upheld and still denied. Called [employer] and left a message for [employer] [phone] with Meagan. Let her know I faxed the last letter this morning to [phone] and Meagan will pass this message to Crystal. Will call Marlet to keep her informed. Will send letter to scan into her chart.

## 2019-08-27 NOTE — Progress Notes (Unsigned)
Nabila Magwood Key: [id number] help? Call us at [phone]  Status: Question Response  Drug: Dimethyl Fumarate 240MG  dr capsules  Form: [employer] Electronic PA Form (2017 NCPDP)  Problem with this form? Call 03-04-2005 at [phone].

## 2019-08-28 ENCOUNTER — Telehealth: Payer: Self-pay

## 2019-08-28 NOTE — Telephone Encounter (Signed)
Started prior authorization

## 2019-09-09 NOTE — Progress Notes (Unsigned)
Wyona Hornbeck (Key: [id number])  Your information has been submitted to [employer]. To check for an updated outcome later, reopen this PA request from your dashboard.  If [employer] has not responded to your request within 24 hours, contact [employer] at [phone]. If you think there may be a problem with your PA request, use our live chat feature at the bottom right.

## 2019-09-09 NOTE — Progress Notes (Signed)
Approval for Tecifedra approved generic. Approved for one year. [id number].

## 2019-09-30 MED ORDER — DIMETHYL FUMARATE 240 MG PO CPDR: DELAYED_RELEASE_CAPSULE | ORAL | 3 refills | Status: DC

## 2019-10-01 MED ORDER — DIMETHYL FUMARATE 240 MG PO CPDR: DELAYED_RELEASE_CAPSULE | ORAL | 3 refills | Status: DC

## 2019-10-01 NOTE — Addendum Note (Signed)
Addended by: Van Clines on: 10/01/2019 08:32 AM   Modules accepted: Orders

## 2019-10-10 DIAGNOSIS — L858 Other specified epidermal thickening: Secondary | ICD-10-CM | POA: Diagnosis not present

## 2019-10-10 DIAGNOSIS — D1801 Hemangioma of skin and subcutaneous tissue: Secondary | ICD-10-CM | POA: Diagnosis not present

## 2019-10-10 DIAGNOSIS — D485 Neoplasm of uncertain behavior of skin: Secondary | ICD-10-CM | POA: Diagnosis not present

## 2019-10-10 DIAGNOSIS — D2262 Melanocytic nevi of left upper limb, including shoulder: Secondary | ICD-10-CM | POA: Diagnosis not present

## 2019-10-10 DIAGNOSIS — D225 Melanocytic nevi of trunk: Secondary | ICD-10-CM | POA: Diagnosis not present

## 2019-10-10 DIAGNOSIS — D2261 Melanocytic nevi of right upper limb, including shoulder: Secondary | ICD-10-CM | POA: Diagnosis not present

## 2019-11-13 DIAGNOSIS — D485 Neoplasm of uncertain behavior of skin: Secondary | ICD-10-CM | POA: Diagnosis not present

## 2019-11-13 DIAGNOSIS — L988 Other specified disorders of the skin and subcutaneous tissue: Secondary | ICD-10-CM | POA: Diagnosis not present

## 2019-11-18 DIAGNOSIS — Z03818 Encounter for observation for suspected exposure to other biological agents ruled out: Secondary | ICD-10-CM | POA: Diagnosis not present

## 2019-11-18 DIAGNOSIS — Z20822 Contact with and (suspected) exposure to covid-19: Secondary | ICD-10-CM | POA: Diagnosis not present

## 2019-12-29 DIAGNOSIS — Z1231 Encounter for screening mammogram for malignant neoplasm of breast: Secondary | ICD-10-CM | POA: Diagnosis not present

## 2020-01-09 NOTE — Progress Notes (Addendum)
Follow-up Visit   Date: 01/12/20   Bridget Edwards MRN: [id number] DOB: 09/22/1975   Interim History: Bridget Edwards is a 44 y.o. right-handed Caucasian female returning to the clinic for follow-up of relapsing remitting multiple sclerosis.  The patient was accompanied to the clinic by self.  She has been doing well over the past several months with no new complaints.  In June, she switched from Tecfidera to generic dimethyl fumerate 240mg  BID and has been tolerating it well.  Occasionally, she has flushing but not severe enough to treat.  She denies any vision changes, numbness/tingling, or weakness.  She has been fully vaccinated and will be scheduling her booster after getting her mammogram next month.    Medications:  Current Outpatient Medications on File Prior to Visit  Medication Sig Dispense Refill  . aspirin 325 MG tablet Take 325 mg by mouth every 6 (six) hours as needed.    . Dimethyl Fumarate 240 MG CPDR Take 1 capsule twice a day 180 capsule 3  . ibuprofen (ADVIL,MOTRIN) 600 MG tablet ibuprofen 600 mg tablet    . MAGNESIUM PO Take by mouth.    . ondansetron (ZOFRAN-ODT) 8 MG disintegrating tablet ondansetron 8 mg disintegrating tablet    . PRENATAL VITAMINS PO Take 1 tablet by mouth daily.    VITAMIN D, ERGOCALCIFEROL, PO Take by mouth.     No current facility-administered medications on file prior to visit.    Allergies: No Known Allergies  Vital Signs:  BP 130/86   Pulse 86   Resp 18   Ht 5\' 5"  (1.651 m)   Wt 133 lb (60.3 kg)   SpO2 98%   BMI 22.13 kg/m   Neurological Exam: MENTAL STATUS including orientation to time, place, person, recent and remote memory, attention span and concentration, language, and fund of knowledge is normal.  Speech is not dysarthric.  CRANIAL NERVES:  No visual field defects.  Pupils equal round and reactive.  Normal conjugate, extra-ocular eye movements in all directions of gaze.  No ptosis.  Face is symmetric. Palate  elevates symmetrically.  Tongue is midline.  MOTOR:  Motor strength is 5/5 in all extremities  MSRs:  Reflexes are 2+/4 throughout.  SENSORY:  Intact to vibration throughout.  COORDINATION/GAIT:  Gait narrow based and stable.   Data: MRI brain and cervical spine 11/30/2017: Multifocal areas of increased T2 and FLAIR signal within the cord which are less conspicuous when compared to last year's exam. These are most notably present at C2-3, C6 and T1-2. No new or progressive lesions. No contrast enhancement.  Stable mid cervical spondylosis.  Lab Results  Component Value Date   WBC 6.4 07/08/2019   HGB 12.9 07/08/2019   HCT 37.9 07/08/2019   MCV 91.8 07/08/2019   PLT 260 07/08/2019   Lab Results  Component Value Date   CREATININE 0.71 07/08/2019   BUN 15 07/08/2019   NA 139 07/08/2019   K 4.3 07/08/2019   CL 103 07/08/2019   CO2 25 07/08/2019   Lab Results  Component Value Date   ALT 11 07/08/2019   AST 11 07/08/2019   ALKPHOS 38 (L) 07/01/2018   BILITOT 0.6 07/08/2019    IMPRESSION/PLAN: Relapsing remitting multiple sclerosis (diagnosed 12/2016) manifesting with bilateral feet paresthesias and right eye papillitis.  Disease burden involves cortical white matter and multifocal short segments in the cervical cord. Tecfidera was started in fall 2018 and she started generic dimethyl fumerate in June 2020.  Clinically, she is doing great with no new neurological complaints.  She is due for surveillance imaging - check MRI brain and cervical spine wwo contrast  Continue dimethyl fumerate 240mg  BID Continue vitamin D 5000 units daily Check CBC and CMP, labs from Feb 2021 reviewed  Return to clinic in 1 year.   Thank you for allowing me to participate in patient's care.  If I can answer any additional questions, I would be pleased to do so.    Sincerely,    Yumalay Circle K. Mar 2021, DO

## 2020-01-12 ENCOUNTER — Ambulatory Visit: Payer: Federal, State, Local not specified - PPO | Admitting: Neurology

## 2020-01-12 ENCOUNTER — Other Ambulatory Visit: Payer: Self-pay

## 2020-01-12 ENCOUNTER — Other Ambulatory Visit (INDEPENDENT_AMBULATORY_CARE_PROVIDER_SITE_OTHER): Payer: Federal, State, Local not specified - PPO

## 2020-01-12 ENCOUNTER — Encounter: Payer: Self-pay | Admitting: Neurology

## 2020-01-12 ENCOUNTER — Other Ambulatory Visit: Payer: Federal, State, Local not specified - PPO

## 2020-01-12 VITALS — BP 130/86 | HR 86 | Resp 18 | Ht 65.0 in | Wt 133.0 lb

## 2020-01-12 DIAGNOSIS — G35 Multiple sclerosis: Secondary | ICD-10-CM

## 2020-01-12 DIAGNOSIS — R202 Paresthesia of skin: Secondary | ICD-10-CM

## 2020-01-12 LAB — COMPREHENSIVE METABOLIC PANEL
ALT: 9 U/L (ref 0–35)
AST: 13 U/L (ref 0–37)
Albumin: 4.8 g/dL (ref 3.5–5.2)
Alkaline Phosphatase: 37 U/L — ABNORMAL LOW (ref 39–117)
BUN: 11 mg/dL (ref 6–23)
CO2: 27 mEq/L (ref 19–32)
Calcium: 9.2 mg/dL (ref 8.4–10.5)
Chloride: 105 mEq/L (ref 96–112)
Creatinine, Ser: 0.63 mg/dL (ref 0.40–1.20)
GFR: 102.7 mL/min (ref >60.00)
Glucose, Bld: 92 mg/dL (ref 70–99)
Potassium: 4.2 mEq/L (ref 3.5–5.1)
Sodium: 140 mEq/L (ref 135–145)
Total Bilirubin: 0.5 mg/dL (ref 0.2–1.2)
Total Protein: 7.1 g/dL (ref 6.0–8.3)

## 2020-01-12 LAB — CBC
HCT: 37.3 % (ref 36.0–46.0)
Hemoglobin: 12.6 g/dL (ref 12.0–15.0)
MCHC: 33.8 g/dL (ref 30.0–36.0)
MCV: 91.4 fl (ref 78.0–100.0)
Platelets: 230 10*3/uL (ref 150.0–400.0)
RBC: 4.08 Mil/uL (ref 3.87–5.11)
RDW: 13.1 % (ref 11.5–15.5)
WBC: 4.4 10*3/uL (ref 4.0–10.5)

## 2020-01-12 LAB — WHITE CELL DIFFERENTIAL
Band Neutrophils: 0 %
Basophils Relative: 1 % (ref 0.0–3.0)
Eosinophils Relative: 2 % (ref 0.0–5.0)
Lymphocytes Relative: 10 % — ABNORMAL LOW (ref 12.0–46.0)
Monocytes Relative: 8 % (ref 3.0–12.0)
Neutrophils Relative %: 79 % — ABNORMAL HIGH (ref 43.0–77.0)

## 2020-01-12 NOTE — Addendum Note (Signed)
Addended by: Adline Mango I on: 01/12/2020 03:27 PM   Modules accepted: Orders

## 2020-01-12 NOTE — Patient Instructions (Addendum)
We will check MRI brain and cervical spine in November 2021  Return to clinic in 1 year   We have sent a referral to [facility] for your MRI and they will call you directly to schedule your appointment. They are located at [address] [facility]. If you need to contact them directly please call [phone].  Your provider has requested that you have labwork completed today. Please go to [facility] ([address]) on the second floor of this building before leaving the office today. You do not need to check in. If you are not called within 15 minutes please check with the front desk.

## 2020-01-12 NOTE — Addendum Note (Signed)
Addended by: Wende Mott on: 01/12/2020 09:36 AM   Modules accepted: Orders

## 2020-01-12 NOTE — Addendum Note (Signed)
Addended by: Allean Found R on: 01/12/2020 09:06 AM   Modules accepted: Orders

## 2020-01-12 NOTE — Addendum Note (Signed)
Addended by: Adline Mango I on: 01/12/2020 04:08 PM   Modules accepted: Orders

## 2020-01-12 NOTE — Addendum Note (Signed)
Addended by: Allean Found R on: 01/12/2020 03:05 PM   Modules accepted: Orders

## 2020-01-21 DIAGNOSIS — R928 Other abnormal and inconclusive findings on diagnostic imaging of breast: Secondary | ICD-10-CM | POA: Diagnosis not present

## 2020-01-29 DIAGNOSIS — Z01419 Encounter for gynecological examination (general) (routine) without abnormal findings: Secondary | ICD-10-CM | POA: Diagnosis not present

## 2020-03-16 ENCOUNTER — Ambulatory Visit
Admission: RE | Admit: 2020-03-16 | Discharge: 2020-03-16 | Disposition: A | Discharge status: Home / Self Care | Payer: Federal, State, Local not specified - PPO | Source: Ambulatory Visit | Attending: Neurology | Admitting: Neurology

## 2020-03-16 ENCOUNTER — Other Ambulatory Visit: Payer: Self-pay

## 2020-03-16 DIAGNOSIS — G35 Multiple sclerosis: Secondary | ICD-10-CM

## 2020-03-16 DIAGNOSIS — M50222 Other cervical disc displacement at C5-C6 level: Secondary | ICD-10-CM | POA: Diagnosis not present

## 2020-03-16 DIAGNOSIS — M50221 Other cervical disc displacement at C4-C5 level: Secondary | ICD-10-CM | POA: Diagnosis not present

## 2020-03-16 DIAGNOSIS — M4802 Spinal stenosis, cervical region: Secondary | ICD-10-CM | POA: Diagnosis not present

## 2020-03-16 DIAGNOSIS — M50223 Other cervical disc displacement at C6-C7 level: Secondary | ICD-10-CM | POA: Diagnosis not present

## 2020-03-16 MED ORDER — GADOBENATE DIMEGLUMINE 529 MG/ML IV SOLN
12.0000 mL | Freq: Once | INTRAVENOUS | Status: AC | PRN
  Administered 2020-03-16: 12 mL via INTRAVENOUS

## 2020-05-03 ENCOUNTER — Other Ambulatory Visit: Payer: Self-pay

## 2020-05-05 ENCOUNTER — Telehealth: Payer: Self-pay | Admitting: Neurology

## 2020-05-06 NOTE — Telephone Encounter (Signed)
Spoke to patient via Mychart and patient has received her 30 day supply and will contact us in 2022 when she is ready to have her 90 day supply filled.

## 2020-07-20 DIAGNOSIS — R928 Other abnormal and inconclusive findings on diagnostic imaging of breast: Secondary | ICD-10-CM | POA: Diagnosis not present

## 2020-07-22 ENCOUNTER — Telehealth: Payer: Self-pay

## 2020-07-22 NOTE — Telephone Encounter (Signed)
I received a letter from my insurance that my prior approval for the Dimethyl Fumarate will expire on 09/08/20. Can you please contact them for a renewal request?  To renew they said you can either go online to [email or web address]  or call the Clinical Call Center at  [phone], 7 am to 9 pm EST M-F  Thank you, Bridget Edwards

## 2020-07-22 NOTE — Telephone Encounter (Signed)
I will submit this to the insurance. Thanks!

## 2020-07-26 ENCOUNTER — Encounter: Payer: Self-pay | Admitting: Neurology

## 2020-07-26 NOTE — Progress Notes (Signed)
Received fax approval valid from 06/23/20 to 07/23/20.

## 2020-09-14 ENCOUNTER — Other Ambulatory Visit: Payer: Self-pay | Admitting: Neurology

## 2020-10-18 DIAGNOSIS — D2262 Melanocytic nevi of left upper limb, including shoulder: Secondary | ICD-10-CM | POA: Diagnosis not present

## 2020-10-18 DIAGNOSIS — D225 Melanocytic nevi of trunk: Secondary | ICD-10-CM | POA: Diagnosis not present

## 2020-10-18 DIAGNOSIS — B078 Other viral warts: Secondary | ICD-10-CM | POA: Diagnosis not present

## 2020-10-18 DIAGNOSIS — L858 Other specified epidermal thickening: Secondary | ICD-10-CM | POA: Diagnosis not present

## 2020-10-18 DIAGNOSIS — D2261 Melanocytic nevi of right upper limb, including shoulder: Secondary | ICD-10-CM | POA: Diagnosis not present

## 2020-10-26 DIAGNOSIS — N644 Mastodynia: Secondary | ICD-10-CM | POA: Diagnosis not present

## 2020-11-09 ENCOUNTER — Telehealth: Payer: Self-pay | Admitting: Neurology

## 2020-11-09 NOTE — Telephone Encounter (Signed)
Called patient and informed her of Dr. Eliane Decree recommendations below to reach out to her PCP. Patient verbalized understanding and had no questions or concerns.

## 2020-11-09 NOTE — Telephone Encounter (Signed)
Returning call to [facility] [phone]

## 2020-11-09 NOTE — Telephone Encounter (Signed)
Please advise her to go through her PCP's office for COVID management. They have protocols to help guide what therapies are appropriate for each patient and will be best to guide her.  As neurologists, we do not treat COVID.  I wish her a speedy recovery.

## 2020-11-09 NOTE — Telephone Encounter (Signed)
Called patient and left a message for a call back.  

## 2020-11-09 NOTE — Telephone Encounter (Signed)
Pt called in, just tested positive for covid. Wants to know if she can get the monoclonal antibodies. Please rtn call [phone]

## 2020-11-10 DIAGNOSIS — U071 COVID-19: Secondary | ICD-10-CM | POA: Diagnosis not present

## 2020-11-10 DIAGNOSIS — G35 Multiple sclerosis: Secondary | ICD-10-CM | POA: Diagnosis not present

## 2020-11-23 ENCOUNTER — Encounter: Payer: Self-pay | Admitting: Neurology

## 2021-01-10 ENCOUNTER — Ambulatory Visit: Payer: Federal, State, Local not specified - PPO | Admitting: Neurology

## 2021-01-10 ENCOUNTER — Other Ambulatory Visit (INDEPENDENT_AMBULATORY_CARE_PROVIDER_SITE_OTHER): Payer: Federal, State, Local not specified - PPO

## 2021-01-10 ENCOUNTER — Encounter: Payer: Self-pay | Admitting: Neurology

## 2021-01-10 ENCOUNTER — Other Ambulatory Visit: Payer: Self-pay

## 2021-01-10 VITALS — BP 145/85 | HR 90 | Ht 65.0 in | Wt 124.0 lb

## 2021-01-10 DIAGNOSIS — Z79899 Other long term (current) drug therapy: Secondary | ICD-10-CM

## 2021-01-10 DIAGNOSIS — G35 Multiple sclerosis: Secondary | ICD-10-CM | POA: Diagnosis not present

## 2021-01-10 LAB — COMPREHENSIVE METABOLIC PANEL
ALT: 11 U/L (ref 0–35)
AST: 14 U/L (ref 0–37)
Albumin: 4.5 g/dL (ref 3.5–5.2)
Alkaline Phosphatase: 33 U/L — ABNORMAL LOW (ref 39–117)
BUN: 13 mg/dL (ref 6–23)
CO2: 29 mEq/L (ref 19–32)
Calcium: 9.3 mg/dL (ref 8.4–10.5)
Chloride: 103 mEq/L (ref 96–112)
Creatinine, Ser: 0.66 mg/dL (ref 0.40–1.20)
GFR: 106.33 mL/min (ref >60.00)
Glucose, Bld: 90 mg/dL (ref 70–99)
Potassium: 4.2 mEq/L (ref 3.5–5.1)
Sodium: 140 mEq/L (ref 135–145)
Total Bilirubin: 0.5 mg/dL (ref 0.2–1.2)
Total Protein: 6.9 g/dL (ref 6.0–8.3)

## 2021-01-10 LAB — CBC WITH DIFFERENTIAL/PLATELET
Basophils Absolute: 0 10*3/uL (ref 0.0–0.1)
Basophils Relative: 1 % (ref 0.0–3.0)
Eosinophils Absolute: 0.1 10*3/uL (ref 0.0–0.7)
Eosinophils Relative: 3.3 % (ref 0.0–5.0)
HCT: 35.1 % — ABNORMAL LOW (ref 36.0–46.0)
Hemoglobin: 11.9 g/dL — ABNORMAL LOW (ref 12.0–15.0)
Lymphocytes Relative: 17.4 % (ref 12.0–46.0)
Lymphs Abs: 0.7 10*3/uL (ref 0.7–4.0)
MCHC: 33.8 g/dL (ref 30.0–36.0)
MCV: 91.8 fl (ref 78.0–100.0)
Monocytes Absolute: 0.3 10*3/uL (ref 0.1–1.0)
Monocytes Relative: 6.9 % (ref 3.0–12.0)
Neutro Abs: 3.1 10*3/uL (ref 1.4–7.7)
Neutrophils Relative %: 71.4 % (ref 43.0–77.0)
Platelets: 218 10*3/uL (ref 150.0–400.0)
RBC: 3.82 Mil/uL — ABNORMAL LOW (ref 3.87–5.11)
RDW: 13.2 % (ref 11.5–15.5)
WBC: 4.3 10*3/uL (ref 4.0–10.5)

## 2021-01-10 NOTE — Progress Notes (Signed)
Follow-up Visit   Date: 01/10/21   [name] MRN: [id number] DOB: 1975/10/22   Interim History: Bridget Edwards is a 45 y.o. right-handed Caucasian female returning to the clinic for follow-up of relapsing remitting multiple sclerosis.  The patient was accompanied to the clinic by self.  She is here for 1 year visit.  Her multiple sclerosis remains well-controlled.  This morning she noticed some tingling over the left forearm, which lasted 1-2 seconds.  It has not recurred.  She denies any vision changes, weakness, or imbalance. She is tolerating dimethyl fumerate 240mg  BID and been compliant, which costs her $20/month.  She had COVID during the summer with mild symptoms.  Medications:  Current Outpatient Medications on File Prior to Visit  Medication Sig Dispense Refill   aspirin 325 MG tablet Take 325 mg by mouth every 6 (six) hours as needed.     Dimethyl Fumarate 240 MG CPDR TAKE 1 CAPSULE BY MOUTH 2 TIMES A DAY 60 capsule 3   ibuprofen (ADVIL,MOTRIN) 600 MG tablet ibuprofen 600 mg tablet     PRENATAL VITAMINS PO Take 1 tablet by mouth daily.     VITAMIN D, ERGOCALCIFEROL, PO Take by mouth.     No current facility-administered medications on file prior to visit.    Allergies: No Known Allergies  Vital Signs:  BP (!) 145/85   Pulse 90   Ht 5\' 5"  (1.651 m)   Wt 124 lb (56.2 kg)   SpO2 98%   BMI 20.63 kg/m   Neurological Exam: MENTAL STATUS including orientation to time, place, person, recent and remote memory, attention span and concentration, language, and fund of knowledge is normal.  Speech is not dysarthric.  CRANIAL NERVES:  No visual field defects.  Pupils equal round and reactive.  Normal conjugate, extra-ocular eye movements in all directions of gaze.  No ptosis.  Face is symmetric. Palate elevates symmetrically.  Tongue is midline.  MOTOR:  Motor strength is 5/5 in all extremities  MSRs:  Reflexes are 2+/4 throughout.  SENSORY:  Intact to vibration  throughout.  COORDINATION/GAIT:  Gait narrow based and stable.   Data: MRI brain and cervical spine 11/30/2017: Multifocal areas of increased T2 and FLAIR signal within the cord which are less conspicuous when compared to last year's exam. These are most notably present at C2-3, C6 and T1-2. No new or progressive lesions. No contrast enhancement.   Stable mid cervical spondylosis.  MRI brain spine 03/16/2021: Again demonstrated are multiple T2 hyperintense foci within the bilateral cerebral white matter, which are compatible with the provided history of demyelinating disease. No new white matter lesion is identified. No abnormal enhancement to suggest active demyelination.   A few small nonspecific remote insults within the inferior right cerebellar hemisphere are also also unchanged.   Mild ethmoid sinus mucosal thickening.  MRI cervical spine 01/11/2020: Redemonstration of multiple T2/STIR hyperintense spinal cord lesions. These lesions have decreased in conspicuity as compared to the prior MRI of 11/30/2017. As before, these lesions are most notable at the C2-C3, C6 and T1-T2 levels. No new spinal cord lesion is identified. No abnormal cord enhancement to suggest active demyelination.   At C4-C5, there is a new small left center/foraminal disc protrusion. The left C5 nerve root appears to exit above this disc protrusion within the left foraminal entry zone, and there is otherwise no significant foraminal stenosis at this level.   Cervical spondylosis is otherwise unchanged as compared to 11/30/2017. No more than mild  relative spinal canal narrowing. Unchanged neural foraminal narrowing bilaterally at C5-C6 (moderate right, mild left) and on the left at C6-C7 (mild).        Lab Results  Component Value Date   WBC 4.4 01/12/2020   HGB 12.6 01/12/2020   HCT 37.3 01/12/2020   MCV 91.4 01/12/2020   PLT 230.0 01/12/2020   Lab Results  Component Value Date   CREATININE 0.63 01/12/2020    BUN 11 01/12/2020   NA 140 01/12/2020   K 4.2 01/12/2020   CL 105 01/12/2020   CO2 27 01/12/2020   Lab Results  Component Value Date   ALT 9 01/12/2020   AST 13 01/12/2020   ALKPHOS 37 (L) 01/12/2020   BILITOT 0.5 01/12/2020    IMPRESSION/PLAN: Relapsing remitting multiple sclerosis (diagnosed 12/2016) manifesting with bilateral feet paresthesias and right eye papillitis.  Disease burden involves cortical white matter and multifocal short segments in the cervical cord. Tecfidera was started in fall 2018 and she started generic dimethyl fumerate in June 2020.  Clinically, she is doing well with no new symptoms and good medication tolerability.   She is due to annual imaging - MRI brain and cervical spine wwo contrast Check CBC and CMP  Continue dimethyl fumerate 240mg  BID Continue vitamin D 5000 units daily Recommend annual eye exam  Return to clinic in 1 year  Thank you for allowing me to participate in patient's care.  If I can answer any additional questions, I would be pleased to do so.    Sincerely,    Truxton Stupka K. , DO

## 2021-01-10 NOTE — Patient Instructions (Addendum)
MRI brain and cervical spine wwo contrast  Check labs  Return to clinic in 1 year

## 2021-01-11 DIAGNOSIS — Z0000 Encounter for general adult medical examination without abnormal findings: Secondary | ICD-10-CM | POA: Diagnosis not present

## 2021-01-11 DIAGNOSIS — Z13220 Encounter for screening for lipoid disorders: Secondary | ICD-10-CM | POA: Diagnosis not present

## 2021-01-11 DIAGNOSIS — G35 Multiple sclerosis: Secondary | ICD-10-CM | POA: Diagnosis not present

## 2021-01-30 ENCOUNTER — Other Ambulatory Visit: Payer: Self-pay

## 2021-01-30 ENCOUNTER — Ambulatory Visit
Admission: RE | Admit: 2021-01-30 | Discharge: 2021-01-30 | Disposition: A | Discharge status: Home / Self Care | Payer: Federal, State, Local not specified - PPO | Source: Ambulatory Visit | Attending: Neurology | Admitting: Neurology

## 2021-01-30 DIAGNOSIS — Z79899 Other long term (current) drug therapy: Secondary | ICD-10-CM

## 2021-01-30 DIAGNOSIS — R9082 White matter disease, unspecified: Secondary | ICD-10-CM | POA: Diagnosis not present

## 2021-01-30 DIAGNOSIS — G35 Multiple sclerosis: Secondary | ICD-10-CM

## 2021-01-30 DIAGNOSIS — M50223 Other cervical disc displacement at C6-C7 level: Secondary | ICD-10-CM | POA: Diagnosis not present

## 2021-01-30 MED ORDER — GADOBENATE DIMEGLUMINE 529 MG/ML IV SOLN
11.0000 mL | Freq: Once | INTRAVENOUS | Status: AC | PRN
  Administered 2021-01-30: 11 mL via INTRAVENOUS

## 2021-01-31 DIAGNOSIS — Z01419 Encounter for gynecological examination (general) (routine) without abnormal findings: Secondary | ICD-10-CM | POA: Diagnosis not present

## 2021-01-31 DIAGNOSIS — N6001 Solitary cyst of right breast: Secondary | ICD-10-CM | POA: Diagnosis not present

## 2021-01-31 DIAGNOSIS — Z1211 Encounter for screening for malignant neoplasm of colon: Secondary | ICD-10-CM | POA: Diagnosis not present

## 2021-01-31 DIAGNOSIS — Z124 Encounter for screening for malignant neoplasm of cervix: Secondary | ICD-10-CM | POA: Diagnosis not present

## 2021-02-01 DIAGNOSIS — Z124 Encounter for screening for malignant neoplasm of cervix: Secondary | ICD-10-CM | POA: Diagnosis not present

## 2021-02-08 DIAGNOSIS — Z1211 Encounter for screening for malignant neoplasm of colon: Secondary | ICD-10-CM | POA: Diagnosis not present

## 2021-02-08 DIAGNOSIS — G35 Multiple sclerosis: Secondary | ICD-10-CM | POA: Diagnosis not present

## 2021-02-08 DIAGNOSIS — K900 Celiac disease: Secondary | ICD-10-CM | POA: Diagnosis not present

## 2021-02-23 ENCOUNTER — Other Ambulatory Visit: Payer: Self-pay | Admitting: Neurology

## 2021-02-26 DIAGNOSIS — Z23 Encounter for immunization: Secondary | ICD-10-CM | POA: Diagnosis not present

## 2021-04-27 DIAGNOSIS — D124 Benign neoplasm of descending colon: Secondary | ICD-10-CM | POA: Diagnosis not present

## 2021-04-27 DIAGNOSIS — Z1211 Encounter for screening for malignant neoplasm of colon: Secondary | ICD-10-CM | POA: Diagnosis not present

## 2021-04-27 DIAGNOSIS — K635 Polyp of colon: Secondary | ICD-10-CM | POA: Diagnosis not present

## 2021-06-08 ENCOUNTER — Encounter: Payer: Self-pay | Admitting: Neurology

## 2021-06-09 ENCOUNTER — Telehealth: Payer: Self-pay

## 2021-06-09 NOTE — Telephone Encounter (Signed)
F/u   [facility] Key: [id number] - PA Case ID: [id number] Need help? Call us at [phone] Status Sent to [employer] Dimethyl Fumarate 240MG  dr capsules Form [employer] Electronic PA Form [phone] NCPDP)

## 2021-06-09 NOTE — Telephone Encounter (Signed)
New message   Your information has been submitted to [employer]. To check for an updated outcome later, reopen this PA request from your dashboard.  If [employer] has not responded to your request within 24 hours, contact [employer] at [phone]. If you think there may be a problem with your PA request, use our live chat feature at the bottom right.  Bridget Edwards Key: [id number] - PA Case ID: [id number] Need help? Call us at [phone] Status Sent to [employer] [facility] Dimethyl Fumarate 240MG  dr capsules Form [employer] Electronic PA Form [phone] NCPDP)

## 2021-06-14 NOTE — Telephone Encounter (Signed)
Bridget Edwards Key: [id number] - PA Case ID: [id number] Need help? Call us at [phone] Outcome Deniedon January 27 Your PA request has been denied. Additional information will be provided in the denial communication. (Message 1140) Drug Dimethyl Fumarate 240MG  dr capsules Form [employer] Electronic PA Form [phone] NCPDP)

## 2021-06-23 NOTE — Telephone Encounter (Addendum)
F/u - waiting on questions   Your demographic data has been sent to [facility] successfully!  [employer] typically takes 5-10 minutes to respond, but it may take a little longer in some cases. You will be notified by email when available. You can also check for an update later by opening this request from your dashboard. Please do not fax or call [employer] to resubmit this request. If you need assistance, please chat with CoverMyMeds or call us at [phone].  If it has been longer than 24 hours, please reach out to [employer].  Hannelore Ruffalo Key: [id number] help? Call us at [phone] Status Sent to [employer] [facility] Dimethyl Fumarate 240MG  dr capsules Form [employer] Electronic PA Form [phone] NCPDP)

## 2021-06-23 NOTE — Telephone Encounter (Signed)
Your information has been submitted to [employer]. To check for an updated outcome later, reopen this PA request from your dashboard.  If [employer] has not responded to your request within 24 hours, contact [employer] at [phone]. If you think there may be a problem with your PA request, use our live chat feature at the bottom right.  [facility] We[facility] Pedro KeyTye [place] - PA Case ID: [id number] Need help? Call us at [phone] Status Sent to [employer] [facility] Dimethyl Fumarate 240MG  dr capsules Form [employer] Electronic PA Form [phone] NCPDP)

## 2021-06-27 NOTE — Telephone Encounter (Signed)
F/u  [facility] [place] KeyTye [place] - PA Case ID: [id number] Need help? Call us at [phone] Outcome Approvedon February 10 Your PA request has been approved. Additional information will be provided in the approval communication. (Message 1145) Drug Dimethyl Fumarate 240MG  dr capsules Form [employer] Electronic PA Form [phone] NCPDP)

## 2021-06-30 ENCOUNTER — Telehealth: Payer: Self-pay

## 2021-06-30 NOTE — Telephone Encounter (Signed)
New message   The Prior Authorization request has been approved for Dimethyl Fumarate 240mg  (generic of Tecfidera)  The authorization is valid from 05/24/2021 through 06/23/2022.

## 2021-08-01 ENCOUNTER — Other Ambulatory Visit: Payer: Self-pay | Admitting: Neurology

## 2021-08-02 DIAGNOSIS — N6001 Solitary cyst of right breast: Secondary | ICD-10-CM | POA: Diagnosis not present

## 2021-10-03 ENCOUNTER — Other Ambulatory Visit: Payer: Self-pay | Admitting: Neurology

## 2021-10-13 DIAGNOSIS — M25512 Pain in left shoulder: Secondary | ICD-10-CM | POA: Diagnosis not present

## 2021-10-20 DIAGNOSIS — B078 Other viral warts: Secondary | ICD-10-CM | POA: Diagnosis not present

## 2021-10-20 DIAGNOSIS — D225 Melanocytic nevi of trunk: Secondary | ICD-10-CM | POA: Diagnosis not present

## 2021-10-20 DIAGNOSIS — D1801 Hemangioma of skin and subcutaneous tissue: Secondary | ICD-10-CM | POA: Diagnosis not present

## 2021-10-20 DIAGNOSIS — B360 Pityriasis versicolor: Secondary | ICD-10-CM | POA: Diagnosis not present

## 2021-10-20 DIAGNOSIS — D2262 Melanocytic nevi of left upper limb, including shoulder: Secondary | ICD-10-CM | POA: Diagnosis not present

## 2021-10-21 DIAGNOSIS — M7502 Adhesive capsulitis of left shoulder: Secondary | ICD-10-CM | POA: Diagnosis not present

## 2021-10-21 DIAGNOSIS — M25512 Pain in left shoulder: Secondary | ICD-10-CM | POA: Diagnosis not present

## 2021-10-23 ENCOUNTER — Encounter: Payer: Self-pay | Admitting: Neurology

## 2021-10-25 NOTE — Telephone Encounter (Signed)
Called patient back with recommendations from Dr. Everlena Cooper patient has gone to her orthopedist and received a shot and some exercises she is doing at home. She wanted to reach out and wasn't sure if it was her MS

## 2021-11-03 DIAGNOSIS — M7502 Adhesive capsulitis of left shoulder: Secondary | ICD-10-CM | POA: Diagnosis not present

## 2021-12-07 DIAGNOSIS — M7502 Adhesive capsulitis of left shoulder: Secondary | ICD-10-CM | POA: Diagnosis not present

## 2021-12-20 DIAGNOSIS — M7502 Adhesive capsulitis of left shoulder: Secondary | ICD-10-CM | POA: Diagnosis not present

## 2021-12-22 ENCOUNTER — Other Ambulatory Visit: Payer: Self-pay | Admitting: Neurology

## 2022-01-10 DIAGNOSIS — M7502 Adhesive capsulitis of left shoulder: Secondary | ICD-10-CM | POA: Diagnosis not present

## 2022-01-10 NOTE — Progress Notes (Unsigned)
Follow-up Visit   Date: 01/11/22   [name] MRN: [id number] DOB: 1975-07-01   Interim History: Bridget Edwards is a 46 y.o. right-handed Caucasian female returning to the clinic for follow-up of relapsing remitting multiple sclerosis.  The patient was accompanied to the clinic by self.   IMPRESSION/PLAN: Relapsing remitting multiple sclerosis (12/2016) manifesting with bilateral feet paresthesias and right eye papillitis.  Disease burden involving the cortical white matter and cervical cord.  Clinically, she has been stable with no new symptoms.  - 2018 started Tecfidera  - 2020 started generic dimethyl fumerate  She is due for annual imaging - MRI brain and cervical spine Check CBC and CMP Continue dimethyl fumerate 240mg  BID Continue vitamin D 5000 units daily Eye exam follow-up in December Questions answered the best of my ability   Return to clinic in 1 year   -------------------------------------------------------------------------------------- UPDATE 01/11/2022:  She is here for 1 year visit.  She has been doing well and denies any new issues related to MS.  During the summer, she developed left shoulder pain and restricted motion.  She is seeing Dr. 03-10-1976 at [facility] and diagnosed with adhesive capsulitis.  She doing doing PT and had steroid injection, but continues to have restricted arm extension.  No new neurological issues, such as vision changes, weakness, numbness/tingling, or imbalance.  She remains compliant with dimethyl fumerate 240mg  BID with good adherence.  Her father was diagnosed pancreatic cancer in April, so she has been under stress because of this.  She also has many questions related to COVID and her risk of exposure, I tried my best to answer them.   Medications:  Current Outpatient Medications on File Prior to Visit  Medication Sig Dispense Refill   aspirin 325 MG tablet Take 325 mg by mouth every 6 (six) hours as needed.      Dimethyl Fumarate 240 MG CPDR TAKE 1 CAPSULE BY MOUTH 2 TIMES A DAY 60 capsule 11   ibuprofen (ADVIL,MOTRIN) 600 MG tablet PRN     PRENATAL VITAMINS PO Take 1 tablet by mouth daily.     VITAMIN D, ERGOCALCIFEROL, PO Take by mouth.     No current facility-administered medications on file prior to visit.    Allergies: No Known Allergies  Vital Signs:  BP (!) 135/90   Pulse 87   Ht 5\' 5"  (1.651 m)   Wt 124 lb (56.2 kg)   SpO2 100%   BMI 20.63 kg/m   Neurological Exam: MENTAL STATUS including orientation to time, place, person, recent and remote memory, attention span and concentration, language, and fund of knowledge is normal.  Speech is not dysarthric.  CRANIAL NERVES:  No visual field defects.  Pupils equal round and reactive.  Normal conjugate, extra-ocular eye movements in all directions of gaze.  No ptosis.  Face is symmetric. Palate elevates symmetrically.  Tongue is midline.  MOTOR:  Motor strength is 5/5 in all extremities  MSRs:  Reflexes are 2+/4 throughout.  SENSORY:  Intact to vibration throughout.  COORDINATION/GAIT:  Gait narrow based and stable.   Data: MRI brain and cervical spine 11/30/2017: Multifocal areas of increased T2 and FLAIR signal within the cord which are less conspicuous when compared to last year's exam. These are most notably present at C2-3, C6 and T1-2. No new or progressive lesions. No contrast enhancement.   Stable mid cervical spondylosis.  MRI brain spine 03/16/2021: Again demonstrated are multiple T2 hyperintense foci within the bilateral cerebral white matter,  which are compatible with the provided history of demyelinating disease. No new white matter lesion is identified. No abnormal enhancement to suggest active demyelination.   A few small nonspecific remote insults within the inferior right cerebellar hemisphere are also also unchanged.   Mild ethmoid sinus mucosal thickening.  MRI cervical spine 01/11/2020: Redemonstration of  multiple T2/STIR hyperintense spinal cord lesions. These lesions have decreased in conspicuity as compared to the prior MRI of 11/30/2017. As before, these lesions are most notable at the C2-C3, C6 and T1-T2 levels. No new spinal cord lesion is identified. No abnormal cord enhancement to suggest active demyelination.   At C4-C5, there is a new small left center/foraminal disc protrusion. The left C5 nerve root appears to exit above this disc protrusion within the left foraminal entry zone, and there is otherwise no significant foraminal stenosis at this level.   Cervical spondylosis is otherwise unchanged as compared to 11/30/2017. No more than mild relative spinal canal narrowing. Unchanged neural foraminal narrowing bilaterally at C5-C6 (moderate right, mild left) and on the left at C6-C7 (mild).   MRI brain and cervical wwo contrast 01/30/2021: 1. Unchanged T2 hyperintense foci in the bilateral cerebral white matter, which are compatible with provided history of demyelinating disease. No new or enhancing lesions to suggest active demyelination. 2. Unchanged T2 hyperintense foci in the spinal cord, compatible with provided history of demyelinating disease. No new or enhancing lesions to suggest active demyelination. 3. C5-C6 and C6-C7 mild bilateral neural foraminal narrowing.    Lab Results  Component Value Date   WBC 4.3 01/10/2021   HGB 11.9 (L) 01/10/2021   HCT 35.1 (L) 01/10/2021   MCV 91.8 01/10/2021   PLT 218.0 01/10/2021   Lab Results  Component Value Date   CREATININE 0.66 01/10/2021   BUN 13 01/10/2021   NA 140 01/10/2021   K 4.2 01/10/2021   CL 103 01/10/2021   CO2 29 01/10/2021   Lab Results  Component Value Date   ALT 11 01/10/2021   AST 14 01/10/2021   ALKPHOS 33 (L) 01/10/2021   BILITOT 0.5 01/10/2021     Thank you for allowing me to participate in patient's care.  If I can answer any additional questions, I would be pleased to do so.     Sincerely,    Audwin Semper K. Allena Katz, DO

## 2022-01-11 ENCOUNTER — Other Ambulatory Visit (INDEPENDENT_AMBULATORY_CARE_PROVIDER_SITE_OTHER): Payer: Federal, State, Local not specified - PPO

## 2022-01-11 ENCOUNTER — Ambulatory Visit: Payer: Federal, State, Local not specified - PPO | Admitting: Neurology

## 2022-01-11 ENCOUNTER — Encounter: Payer: Self-pay | Admitting: Neurology

## 2022-01-11 VITALS — BP 135/90 | HR 87 | Ht 65.0 in | Wt 124.0 lb

## 2022-01-11 DIAGNOSIS — G35 Multiple sclerosis: Secondary | ICD-10-CM

## 2022-01-11 LAB — CBC
HCT: 38.5 % (ref 36.0–46.0)
Hemoglobin: 13.1 g/dL (ref 12.0–15.0)
MCHC: 34 g/dL (ref 30.0–36.0)
MCV: 92.9 fl (ref 78.0–100.0)
Platelets: 235 10*3/uL (ref 150.0–400.0)
RBC: 4.14 Mil/uL (ref 3.87–5.11)
RDW: 13 % (ref 11.5–15.5)
WBC: 6 10*3/uL (ref 4.0–10.5)

## 2022-01-11 LAB — COMPREHENSIVE METABOLIC PANEL
ALT: 11 U/L (ref 0–35)
AST: 13 U/L (ref 0–37)
Albumin: 4.8 g/dL (ref 3.5–5.2)
Alkaline Phosphatase: 35 U/L — ABNORMAL LOW (ref 39–117)
BUN: 12 mg/dL (ref 6–23)
CO2: 28 mEq/L (ref 19–32)
Calcium: 9.1 mg/dL (ref 8.4–10.5)
Chloride: 105 mEq/L (ref 96–112)
Creatinine, Ser: 0.6 mg/dL (ref 0.40–1.20)
GFR: 108.03 mL/min (ref >60.00)
Glucose, Bld: 86 mg/dL (ref 70–99)
Potassium: 4.3 mEq/L (ref 3.5–5.1)
Sodium: 139 mEq/L (ref 135–145)
Total Bilirubin: 0.6 mg/dL (ref 0.2–1.2)
Total Protein: 7.4 g/dL (ref 6.0–8.3)

## 2022-01-11 NOTE — Patient Instructions (Addendum)
MRI brain and cervical spine  ? ?Check labs ? ?Return to clinic in 1 year ?

## 2022-01-18 DIAGNOSIS — M7502 Adhesive capsulitis of left shoulder: Secondary | ICD-10-CM | POA: Diagnosis not present

## 2022-01-31 ENCOUNTER — Other Ambulatory Visit: Payer: Federal, State, Local not specified - PPO

## 2022-01-31 ENCOUNTER — Encounter: Payer: Self-pay | Admitting: Neurology

## 2022-02-08 ENCOUNTER — Ambulatory Visit
Admission: RE | Admit: 2022-02-08 | Discharge: 2022-02-08 | Disposition: A | Discharge status: Home / Self Care | Payer: Federal, State, Local not specified - PPO | Source: Ambulatory Visit | Attending: Neurology | Admitting: Neurology

## 2022-02-08 DIAGNOSIS — G35 Multiple sclerosis: Secondary | ICD-10-CM

## 2022-02-08 DIAGNOSIS — G959 Disease of spinal cord, unspecified: Secondary | ICD-10-CM | POA: Diagnosis not present

## 2022-02-08 MED ORDER — GADOBENATE DIMEGLUMINE 529 MG/ML IV SOLN
10.0000 mL | Freq: Once | INTRAVENOUS | Status: AC | PRN
  Administered 2022-02-08: 10 mL via INTRAVENOUS

## 2022-02-10 DIAGNOSIS — Z800 Family history of malignant neoplasm of digestive organs: Secondary | ICD-10-CM | POA: Diagnosis not present

## 2022-02-10 DIAGNOSIS — G35 Multiple sclerosis: Secondary | ICD-10-CM | POA: Diagnosis not present

## 2022-02-10 DIAGNOSIS — Z0000 Encounter for general adult medical examination without abnormal findings: Secondary | ICD-10-CM | POA: Diagnosis not present

## 2022-02-10 DIAGNOSIS — Z23 Encounter for immunization: Secondary | ICD-10-CM | POA: Diagnosis not present

## 2022-03-09 DIAGNOSIS — Z01419 Encounter for gynecological examination (general) (routine) without abnormal findings: Secondary | ICD-10-CM | POA: Diagnosis not present

## 2022-03-09 DIAGNOSIS — Z1231 Encounter for screening mammogram for malignant neoplasm of breast: Secondary | ICD-10-CM | POA: Diagnosis not present

## 2022-03-09 DIAGNOSIS — N6001 Solitary cyst of right breast: Secondary | ICD-10-CM | POA: Diagnosis not present

## 2022-05-16 ENCOUNTER — Encounter: Payer: Self-pay | Admitting: Neurology

## 2022-12-11 ENCOUNTER — Other Ambulatory Visit: Payer: Self-pay | Admitting: Neurology

## 2022-12-18 DIAGNOSIS — H53143 Visual discomfort, bilateral: Secondary | ICD-10-CM | POA: Diagnosis not present

## 2022-12-18 DIAGNOSIS — H5203 Hypermetropia, bilateral: Secondary | ICD-10-CM | POA: Diagnosis not present

## 2022-12-21 DIAGNOSIS — D225 Melanocytic nevi of trunk: Secondary | ICD-10-CM | POA: Diagnosis not present

## 2022-12-21 DIAGNOSIS — D2262 Melanocytic nevi of left upper limb, including shoulder: Secondary | ICD-10-CM | POA: Diagnosis not present

## 2022-12-21 DIAGNOSIS — D2271 Melanocytic nevi of right lower limb, including hip: Secondary | ICD-10-CM | POA: Diagnosis not present

## 2022-12-21 DIAGNOSIS — D2261 Melanocytic nevi of right upper limb, including shoulder: Secondary | ICD-10-CM | POA: Diagnosis not present

## 2023-01-09 ENCOUNTER — Ambulatory Visit: Payer: Federal, State, Local not specified - PPO | Admitting: Neurology

## 2023-01-09 ENCOUNTER — Other Ambulatory Visit (INDEPENDENT_AMBULATORY_CARE_PROVIDER_SITE_OTHER): Payer: Federal, State, Local not specified - PPO

## 2023-01-09 ENCOUNTER — Encounter: Payer: Self-pay | Admitting: Neurology

## 2023-01-09 VITALS — BP 138/88 | HR 88 | Ht 65.0 in | Wt 137.0 lb

## 2023-01-09 DIAGNOSIS — Z79899 Other long term (current) drug therapy: Secondary | ICD-10-CM | POA: Diagnosis not present

## 2023-01-09 DIAGNOSIS — G35A Relapsing-remitting multiple sclerosis: Secondary | ICD-10-CM

## 2023-01-09 DIAGNOSIS — G35 Multiple sclerosis: Secondary | ICD-10-CM

## 2023-01-09 LAB — CBC WITH DIFFERENTIAL/PLATELET
Basophils Absolute: 0.1 10*3/uL (ref 0.0–0.1)
Basophils Relative: 0.8 % (ref 0.0–3.0)
Eosinophils Absolute: 0.1 10*3/uL (ref 0.0–0.7)
Eosinophils Relative: 1.6 % (ref 0.0–5.0)
HCT: 39 % (ref 36.0–46.0)
Hemoglobin: 13 g/dL (ref 12.0–15.0)
Lymphocytes Relative: 11.8 % — ABNORMAL LOW (ref 12.0–46.0)
Lymphs Abs: 0.8 10*3/uL (ref 0.7–4.0)
MCHC: 33.3 g/dL (ref 30.0–36.0)
MCV: 92.8 fl (ref 78.0–100.0)
Monocytes Absolute: 0.4 10*3/uL (ref 0.1–1.0)
Monocytes Relative: 6.5 % (ref 3.0–12.0)
Neutro Abs: 5.1 10*3/uL (ref 1.4–7.7)
Neutrophils Relative %: 79.3 % — ABNORMAL HIGH (ref 43.0–77.0)
Platelets: 248 10*3/uL (ref 150.0–400.0)
RBC: 4.2 Mil/uL (ref 3.87–5.11)
RDW: 13.5 % (ref 11.5–15.5)
WBC: 6.4 10*3/uL (ref 4.0–10.5)

## 2023-01-09 LAB — COMPREHENSIVE METABOLIC PANEL
ALT: 13 U/L (ref 0–35)
AST: 14 U/L (ref 0–37)
Albumin: 4.5 g/dL (ref 3.5–5.2)
Alkaline Phosphatase: 36 U/L — ABNORMAL LOW (ref 39–117)
BUN: 16 mg/dL (ref 6–23)
CO2: 29 mEq/L (ref 19–32)
Calcium: 9.3 mg/dL (ref 8.4–10.5)
Chloride: 104 mEq/L (ref 96–112)
Creatinine, Ser: 0.65 mg/dL (ref 0.40–1.20)
GFR: 105.23 mL/min (ref >60.00)
Glucose, Bld: 84 mg/dL (ref 70–99)
Potassium: 4.4 mEq/L (ref 3.5–5.1)
Sodium: 141 mEq/L (ref 135–145)
Total Bilirubin: 0.4 mg/dL (ref 0.2–1.2)
Total Protein: 7.1 g/dL (ref 6.0–8.3)

## 2023-01-09 NOTE — Progress Notes (Signed)
Follow-up Visit   Date: 01/09/23   ENVY LUST MRN: [id number] DOB: 05/11/1976   Interim History: Bridget Edwards is a 47 y.o. right-handed Caucasian female returning to the clinic for follow-up of relapsing remitting multiple sclerosis.  The patient was accompanied to the clinic by self.   IMPRESSION/PLAN: Relapsing remitting multiple sclerosis (12/2016) manifesting with bilateral feet paresthesias and right eye papillitis .  Disease burden involving the cortical white matter and cervical cord.  Clinically, she has been stable with no new symptoms. MRI brain and cervical spine has been stable over the past few years.  We will plan to reimage next year.  - 2018 started Tecfidera  - 2020 started generic dimethyl fumerate   Check CBC and CMP Continue dimethyl fumerate 240mg  BID Continue vitamin D 5000 units daily She is up-to-date with eye exam  Return to clinic in 1 year   -------------------------------------------------------------------------------------- UPDATE 01/11/2022:  She is here for 1 year visit.  She has been doing well and denies any new issues related to MS.  During the summer, she developed left shoulder pain and restricted motion.  She is seeing Dr. Rennis Chris at [facility] and diagnosed with adhesive capsulitis.  She doing doing PT and had steroid injection, but continues to have restricted arm extension.  No new neurological issues, such as vision changes, weakness, numbness/tingling, or imbalance.  She remains compliant with dimethyl fumerate 240mg  BID with good adherence.  Her father was diagnosed pancreatic cancer in April, so she has been under stress because of this.  She also has many questions related to COVID and her risk of exposure, I tried my best to answer them.  UPDATE 01/12/2023:  She is here for 1 year follow-up.  She has been doing well without any new complaints.  She is compliant and tolerating dimethyl fumerate.  Occasionally, she gets  flushing.  She had eye exam earlier this month which was normal.  No new vision symptoms, numbness/tingling, or weakness.  Her father passed in September 2023 from pancreatic cancer.   Medications:  Current Outpatient Medications on File Prior to Visit  Medication Sig Dispense Refill   aspirin 325 MG tablet Take 325 mg by mouth every 6 (six) hours as needed.     Dimethyl Fumarate 240 MG CPDR TAKE 1 CAPSULE BY MOUTH 2 TIMES A DAY 60 capsule 11   ibuprofen (ADVIL,MOTRIN) 600 MG tablet PRN     PRENATAL VITAMINS PO Take 1 tablet by mouth daily.     VITAMIN D, ERGOCALCIFEROL, PO Take by mouth.     No current facility-administered medications on file prior to visit.    Allergies: No Known Allergies  Vital Signs:   BP 138/88   Pulse 88   Ht 5\' 5"  (1.651 m)   Wt 137 lb (62.1 kg)   SpO2 99%   BMI 22.80 kg/m   Neurological Exam: MENTAL STATUS including orientation to time, place, person, recent and remote memory, attention span and concentration, language, and fund of knowledge is normal.  Speech is not dysarthric.  CRANIAL NERVES:  No visual field defects.  Pupils equal round and reactive.  Normal conjugate, extra-ocular eye movements in all directions of gaze.  No ptosis.  Face is symmetric. Palate elevates symmetrically.  Tongue is midline.  MOTOR:  Motor strength is 5/5 in all extremities  MSRs:  Reflexes are 2+/4 throughout.  SENSORY:  Intact to vibration throughout.  COORDINATION/GAIT:  Gait narrow based and stable.   Data: MRI brain  and cervical spine 11/30/2017: Multifocal areas of increased T2 and FLAIR signal within the cord which are less conspicuous when compared to last year's exam. These are most notably present at C2-3, C6 and T1-2. No new or progressive lesions. No contrast enhancement.   Stable mid cervical spondylosis.  MRI brain spine 03/16/2021: Again demonstrated are multiple T2 hyperintense foci within the bilateral cerebral white matter, which are compatible  with the provided history of demyelinating disease. No new white matter lesion is identified. No abnormal enhancement to suggest active demyelination.   A few small nonspecific remote insults within the inferior right cerebellar hemisphere are also also unchanged.   Mild ethmoid sinus mucosal thickening.  MRI cervical spine 01/11/2020: Redemonstration of multiple T2/STIR hyperintense spinal cord lesions. These lesions have decreased in conspicuity as compared to the prior MRI of 11/30/2017. As before, these lesions are most notable at the C2-C3, C6 and T1-T2 levels. No new spinal cord lesion is identified. No abnormal cord enhancement to suggest active demyelination.   At C4-C5, there is a new small left center/foraminal disc protrusion. The left C5 nerve root appears to exit above this disc protrusion within the left foraminal entry zone, and there is otherwise no significant foraminal stenosis at this level.   Cervical spondylosis is otherwise unchanged as compared to 11/30/2017. No more than mild relative spinal canal narrowing. Unchanged neural foraminal narrowing bilaterally at C5-C6 (moderate right, mild left) and on the left at C6-C7 (mild).   MRI brain and cervical wwo contrast 01/30/2021: 1. Unchanged T2 hyperintense foci in the bilateral cerebral white matter, which are compatible with provided history of demyelinating disease. No new or enhancing lesions to suggest active demyelination. 2. Unchanged T2 hyperintense foci in the spinal cord, compatible with provided history of demyelinating disease. No new or enhancing lesions to suggest active demyelination. 3. C5-C6 and C6-C7 mild bilateral neural foraminal narrowing.   MRI brain and cervical wwo contrast 02/09/2022: 1. Similar appearance of a few scattered small T2/FLAIR hyperintensities in the white matter, which are nonspecific but compatible with the history of multiple sclerosis. No new or enhancing lesions. 2. Unchanged T2  hyperintense cord lesions compatible with prior demyelination. No new or enhancing lesions.  Lab Results  Component Value Date   WBC 6.0 01/11/2022   HGB 13.1 01/11/2022   HCT 38.5 01/11/2022   MCV 92.9 01/11/2022   PLT 235.0 01/11/2022   Lab Results  Component Value Date   CREATININE 0.60 01/11/2022   BUN 12 01/11/2022   NA 139 01/11/2022   K 4.3 01/11/2022   CL 105 01/11/2022   CO2 28 01/11/2022   Lab Results  Component Value Date   ALT 11 01/11/2022   AST 13 01/11/2022   ALKPHOS 35 (L) 01/11/2022   BILITOT 0.6 01/11/2022     Thank you for allowing me to participate in patient's care.  If I can answer any additional questions, I would be pleased to do so.    Sincerely,    Legend Pecore K. Allena Katz, DO

## 2023-01-12 ENCOUNTER — Ambulatory Visit: Payer: Federal, State, Local not specified - PPO | Admitting: Neurology

## 2023-02-26 DIAGNOSIS — Z13220 Encounter for screening for lipoid disorders: Secondary | ICD-10-CM | POA: Diagnosis not present

## 2023-02-26 DIAGNOSIS — Z23 Encounter for immunization: Secondary | ICD-10-CM | POA: Diagnosis not present

## 2023-02-26 DIAGNOSIS — Z0000 Encounter for general adult medical examination without abnormal findings: Secondary | ICD-10-CM | POA: Diagnosis not present

## 2023-04-10 ENCOUNTER — Other Ambulatory Visit (HOSPITAL_COMMUNITY): Payer: Self-pay

## 2023-04-10 ENCOUNTER — Telehealth: Payer: Self-pay | Admitting: Pharmacy Technician

## 2023-04-10 NOTE — Telephone Encounter (Signed)
Pharmacy Patient Advocate Encounter  Indexed Tecfidera PA form. PA good through 05/16/23

## 2023-04-13 ENCOUNTER — Encounter: Payer: Self-pay | Admitting: Neurology

## 2023-04-16 DIAGNOSIS — Z01419 Encounter for gynecological examination (general) (routine) without abnormal findings: Secondary | ICD-10-CM | POA: Diagnosis not present

## 2023-04-16 DIAGNOSIS — Z1231 Encounter for screening mammogram for malignant neoplasm of breast: Secondary | ICD-10-CM | POA: Diagnosis not present

## 2023-04-19 ENCOUNTER — Telehealth: Payer: Self-pay

## 2023-04-19 ENCOUNTER — Other Ambulatory Visit (HOSPITAL_COMMUNITY): Payer: Self-pay

## 2023-04-19 NOTE — Telephone Encounter (Signed)
*  [facility]  Pharmacy Patient Advocate Encounter   Received notification from CoverMyMeds that prior authorization for Dimethyl Fumarate 240MG  dr capsules  is required/requested.   Insurance verification completed.   The patient is insured through [facility] .   Per test claim: PA required; PA started via CoverMyMeds. KEY [id number] . Waiting for clinical questions to populate.

## 2023-04-19 NOTE — Telephone Encounter (Signed)
PA request has been Submitted. New Encounter created for follow up. For additional info see Pharmacy Prior Auth telephone encounter from 12/05.

## 2023-05-11 ENCOUNTER — Other Ambulatory Visit (HOSPITAL_COMMUNITY): Payer: Self-pay

## 2023-05-11 NOTE — Telephone Encounter (Signed)
Pharmacy Patient Advocate Encounter  Received notification from [facility] that Prior Authorization for Dimethyl Fumarate 240mg  capsules has been APPROVED from N/A to 05/16/2023. Unable to obtain price due to refill too soon rejection, last fill date 04/27/2023 next available fill date01/09/2023

## 2023-05-22 MED ORDER — DIMETHYL FUMARATE 240 MG PO CPDR: DELAYED_RELEASE_CAPSULE | ORAL | 11 refills | Status: DC

## 2023-05-22 NOTE — Addendum Note (Signed)
 Addended by: Glendale Chard on: 05/22/2023 11:06 AM   Modules accepted: Orders

## 2023-12-25 DIAGNOSIS — H04123 Dry eye syndrome of bilateral lacrimal glands: Secondary | ICD-10-CM | POA: Diagnosis not present

## 2023-12-25 DIAGNOSIS — H1789 Other corneal scars and opacities: Secondary | ICD-10-CM | POA: Diagnosis not present

## 2023-12-25 DIAGNOSIS — H53143 Visual discomfort, bilateral: Secondary | ICD-10-CM | POA: Diagnosis not present

## 2024-01-09 ENCOUNTER — Other Ambulatory Visit

## 2024-01-09 ENCOUNTER — Encounter: Payer: Self-pay | Admitting: Neurology

## 2024-01-09 ENCOUNTER — Ambulatory Visit: Payer: Federal, State, Local not specified - PPO | Admitting: Neurology

## 2024-01-09 VITALS — BP 126/90 | HR 95 | Ht 65.0 in | Wt 138.0 lb

## 2024-01-09 DIAGNOSIS — Z79899 Other long term (current) drug therapy: Secondary | ICD-10-CM

## 2024-01-09 DIAGNOSIS — G35 Multiple sclerosis: Secondary | ICD-10-CM | POA: Diagnosis not present

## 2024-01-09 NOTE — Progress Notes (Signed)
 Follow-up Visit   Date: 01/09/24   Bridget Edwards MRN: [id number] DOB: 25-Jul-1975   Interim History: Bridget Edwards is a 48 y.o. right-handed Caucasian female returning to the clinic for follow-up of relapsing remitting multiple sclerosis.  The patient was accompanied to the clinic by self.   IMPRESSION: Relapsing remitting multiple sclerosis (12/2016) manifesting with bilateral feet paresthesias and right eye papillitis.  Disease burden involving the cortical white matter and cervical cord.  Clinically, she has been stable with no new symptoms. MRI brain and cervical spine has been stable over the past few years.   - 2018 started Tecfidera   - 2020 started generic dimethyl fumerate  PLAN: MRI brain and cervical spine wwo contrast Check CBC and CMP Continue dimethyl fumerate 240mg  twice daily Continue vitamin D  5000 units daily She is up-to-date with eye exam  Return to clinic in 1 year   -------------------------------------------------------------------------------------- UPDATE 01/11/2022:  She is here for 1 year visit.  She has been doing well and denies any new issues related to MS.  During the summer, she developed left shoulder pain and restricted motion.  She is seeing Dr. Melita at [facility] and diagnosed with adhesive capsulitis.  She doing doing PT and had steroid injection, but continues to have restricted arm extension.  No new neurological issues, such as vision changes, weakness, numbness/tingling, or imbalance.  She remains compliant with dimethyl fumerate 240mg  BID with good adherence.  Her father was diagnosed pancreatic cancer in April, so she has been under stress because of this.  She also has many questions related to COVID and her risk of exposure, I tried my best to answer them.  UPDATE 01/12/2023:  She is here for 1 year follow-up.  She has been doing well without any new complaints.  She is compliant and tolerating dimethyl fumerate.  Occasionally,  she gets flushing.  She had eye exam earlier this month which was normal.  No new vision symptoms, numbness/tingling, or weakness.  Her father passed in September 2023 from pancreatic cancer.   UPDATE 01/09/2024:  She is here for 1 year follow-up visit.  She has been doing well without any new neurological issues.  She had a tick on her during the summer which she removed.  She did not have any symptoms related to this.  She is very compliant with dimethyl fumerate and denies any significant side effects.   Medications:  Current Outpatient Medications on File Prior to Visit  Medication Sig Dispense Refill   aspirin 325 MG tablet Take 325 mg by mouth every 6 (six) hours as needed.     Dimethyl Fumarate  240 MG CPDR TAKE 1 CAPSULE BY MOUTH 2 TIMES A DAY 60 capsule 11   ibuprofen  (ADVIL ,MOTRIN ) 600 MG tablet PRN     PRENATAL VITAMINS PO Take 1 tablet by mouth daily.     VITAMIN D , ERGOCALCIFEROL , PO Take by mouth.     No current facility-administered medications on file prior to visit.    Allergies: No Known Allergies  Vital Signs:   BP (!) 126/90   Pulse 95   Ht 5' 5 (1.651 m)   Wt 138 lb (62.6 kg)   SpO2 99%   BMI 22.96 kg/m   Neurological Exam: MENTAL STATUS including orientation to time, place, person, recent and remote memory, attention span and concentration, language, and fund of knowledge is normal.  Speech is not dysarthric.  CRANIAL NERVES:    Pupils equal round and reactive.  Normal conjugate,  extra-ocular eye movements in all directions of gaze.  No ptosis.  Face is symmetric.   MOTOR:  Motor strength is 5/5 in all extremities  MSRs:  Reflexes are 2+/4 throughout.  SENSORY:  Intact to vibration throughout.  COORDINATION/GAIT:  Gait narrow based and stable.  Tandem gait intact.   Data: MRI brain and cervical spine 11/30/2017: Multifocal areas of increased T2 and FLAIR signal within the cord which are less conspicuous when compared to last year's exam. These are most  notably present at C2-3, C6 and T1-2. No new or progressive lesions. No contrast enhancement.   Stable mid cervical spondylosis.  MRI brain spine 03/16/2021: Again demonstrated are multiple T2 hyperintense foci within the bilateral cerebral white matter, which are compatible with the provided history of demyelinating disease. No new white matter lesion is identified. No abnormal enhancement to suggest active demyelination.   A few small nonspecific remote insults within the inferior right cerebellar hemisphere are also also unchanged.   Mild ethmoid sinus mucosal thickening.  MRI cervical spine 01/11/2020: Redemonstration of multiple T2/STIR hyperintense spinal cord lesions. These lesions have decreased in conspicuity as compared to the prior MRI of 11/30/2017. As before, these lesions are most notable at the C2-C3, C6 and T1-T2 levels. No new spinal cord lesion is identified. No abnormal cord enhancement to suggest active demyelination.   At C4-C5, there is a new small left center/foraminal disc protrusion. The left C5 nerve root appears to exit above this disc protrusion within the left foraminal entry zone, and there is otherwise no significant foraminal stenosis at this level.   Cervical spondylosis is otherwise unchanged as compared to 11/30/2017. No more than mild relative spinal canal narrowing. Unchanged neural foraminal narrowing bilaterally at C5-C6 (moderate right, mild left) and on the left at C6-C7 (mild).   MRI brain and cervical wwo contrast 01/30/2021: 1. Unchanged T2 hyperintense foci in the bilateral cerebral white matter, which are compatible with provided history of demyelinating disease. No new or enhancing lesions to suggest active demyelination. 2. Unchanged T2 hyperintense foci in the spinal cord, compatible with provided history of demyelinating disease. No new or enhancing lesions to suggest active demyelination. 3. C5-C6 and C6-C7 mild bilateral neural foraminal  narrowing.   MRI brain and cervical wwo contrast 02/09/2022: 1. Similar appearance of a few scattered small T2/FLAIR hyperintensities in the white matter, which are nonspecific but compatible with the history of multiple sclerosis. No new or enhancing lesions. 2. Unchanged T2 hyperintense cord lesions compatible with prior demyelination. No new or enhancing lesions.  Lab Results  Component Value Date   WBC 6.4 01/09/2023   HGB 13.0 01/09/2023   HCT 39.0 01/09/2023   MCV 92.8 01/09/2023   PLT 248.0 01/09/2023   Lab Results  Component Value Date   CREATININE 0.65 01/09/2023   BUN 16 01/09/2023   NA 141 01/09/2023   K 4.4 01/09/2023   CL 104 01/09/2023   CO2 29 01/09/2023   Lab Results  Component Value Date   ALT 13 01/09/2023   AST 14 01/09/2023   ALKPHOS 36 (L) 01/09/2023   BILITOT 0.4 01/09/2023     Thank you for allowing me to participate in patient's care.  If I can answer any additional questions, I would be pleased to do so.    Sincerely,    Irja Wheless K. Tobie, DO

## 2024-01-09 NOTE — Patient Instructions (Signed)
 Check labs MRI brain and cervical spine

## 2024-01-10 ENCOUNTER — Ambulatory Visit: Payer: Self-pay | Admitting: Neurology

## 2024-01-10 LAB — COMPREHENSIVE METABOLIC PANEL WITH GFR
AG Ratio: 2.2 (calc) (ref 1.0–2.5)
ALT: 12 U/L (ref 6–29)
AST: 14 U/L (ref 10–35)
Albumin: 5.1 g/dL (ref 3.6–5.1)
Alkaline phosphatase (APISO): 40 U/L (ref 31–125)
BUN: 20 mg/dL (ref 7–25)
CO2: 27 mmol/L (ref 20–32)
Calcium: 9.5 mg/dL (ref 8.6–10.2)
Chloride: 102 mmol/L (ref 98–110)
Creat: 0.68 mg/dL (ref 0.50–0.99)
Globulin: 2.3 g/dL (ref 1.9–3.7)
Glucose, Bld: 82 mg/dL (ref 65–99)
Potassium: 4.2 mmol/L (ref 3.5–5.3)
Sodium: 138 mmol/L (ref 135–146)
Total Bilirubin: 0.7 mg/dL (ref 0.2–1.2)
Total Protein: 7.4 g/dL (ref 6.1–8.1)
eGFR: 108 mL/min/1.73m2 (ref >=60)

## 2024-01-10 LAB — CBC WITH DIFFERENTIAL/PLATELET
Absolute Lymphocytes: 876 {cells}/uL (ref 850–3900)
Absolute Monocytes: 366 {cells}/uL (ref 200–950)
Basophils Absolute: 42 {cells}/uL (ref 0–200)
Basophils Relative: 0.7 %
Eosinophils Absolute: 108 {cells}/uL (ref 15–500)
Eosinophils Relative: 1.8 %
HCT: 41.6 % (ref 35.0–45.0)
Hemoglobin: 13.8 g/dL (ref 11.7–15.5)
MCH: 31.2 pg (ref 27.0–33.0)
MCHC: 33.2 g/dL (ref 32.0–36.0)
MCV: 94.1 fL (ref 80.0–100.0)
MPV: 10.5 fL (ref 7.5–12.5)
Monocytes Relative: 6.1 %
Neutro Abs: 4608 {cells}/uL (ref 1500–7800)
Neutrophils Relative %: 76.8 %
Platelets: 240 Thousand/uL (ref 140–400)
RBC: 4.42 Million/uL (ref 3.80–5.10)
RDW: 13.2 % (ref 11.0–15.0)
Total Lymphocyte: 14.6 %
WBC: 6 Thousand/uL (ref 3.8–10.8)

## 2024-01-29 DIAGNOSIS — D2262 Melanocytic nevi of left upper limb, including shoulder: Secondary | ICD-10-CM | POA: Diagnosis not present

## 2024-01-29 DIAGNOSIS — D2271 Melanocytic nevi of right lower limb, including hip: Secondary | ICD-10-CM | POA: Diagnosis not present

## 2024-01-29 DIAGNOSIS — D2272 Melanocytic nevi of left lower limb, including hip: Secondary | ICD-10-CM | POA: Diagnosis not present

## 2024-01-29 DIAGNOSIS — D2261 Melanocytic nevi of right upper limb, including shoulder: Secondary | ICD-10-CM | POA: Diagnosis not present

## 2024-03-18 ENCOUNTER — Encounter: Payer: Self-pay | Admitting: Neurology

## 2024-03-19 ENCOUNTER — Ambulatory Visit
Admission: RE | Admit: 2024-03-19 | Discharge: 2024-03-19 | Disposition: A | Discharge status: Home / Self Care | Source: Ambulatory Visit | Attending: Neurology | Admitting: Neurology

## 2024-03-19 DIAGNOSIS — G35A Relapsing-remitting multiple sclerosis: Secondary | ICD-10-CM

## 2024-03-19 MED ORDER — GADOPICLENOL 0.5 MMOL/ML IV SOLN
6.0000 mL | Freq: Once | INTRAVENOUS | Status: AC | PRN
  Administered 2024-03-19: 6 mL via INTRAVENOUS

## 2024-04-30 DIAGNOSIS — Z01419 Encounter for gynecological examination (general) (routine) without abnormal findings: Secondary | ICD-10-CM | POA: Diagnosis not present

## 2024-04-30 DIAGNOSIS — Z1231 Encounter for screening mammogram for malignant neoplasm of breast: Secondary | ICD-10-CM | POA: Diagnosis not present

## 2024-05-01 DIAGNOSIS — Z124 Encounter for screening for malignant neoplasm of cervix: Secondary | ICD-10-CM | POA: Diagnosis not present

## 2024-05-14 DIAGNOSIS — R9230 Dense breasts, unspecified: Secondary | ICD-10-CM | POA: Diagnosis not present

## 2024-06-13 ENCOUNTER — Encounter: Payer: Self-pay | Admitting: Neurology

## 2024-06-13 DIAGNOSIS — G35A Relapsing-remitting multiple sclerosis: Secondary | ICD-10-CM

## 2024-06-13 MED ORDER — DIMETHYL FUMARATE 240 MG PO CPDR: DELAYED_RELEASE_CAPSULE | ORAL | 11 refills | Status: DC

## 2024-06-17 MED ORDER — DIMETHYL FUMARATE 240 MG PO CPDR: DELAYED_RELEASE_CAPSULE | ORAL | 11 refills | Status: AC

## 2025-01-07 ENCOUNTER — Ambulatory Visit: Admitting: Neurology
